# Patient Record
Sex: Male | Born: 1945
Health system: Southern US, Community
[De-identification: ages and names within clinical notes are randomized; demographics above are authoritative.]

## PROBLEM LIST (undated history)

## (undated) DIAGNOSIS — I10 Essential (primary) hypertension: Secondary | ICD-10-CM

## (undated) DIAGNOSIS — N400 Enlarged prostate without lower urinary tract symptoms: Secondary | ICD-10-CM

## (undated) DIAGNOSIS — N289 Disorder of kidney and ureter, unspecified: Secondary | ICD-10-CM

## (undated) DIAGNOSIS — I454 Nonspecific intraventricular block: Secondary | ICD-10-CM

## (undated) DIAGNOSIS — N200 Calculus of kidney: Secondary | ICD-10-CM

## (undated) DIAGNOSIS — M109 Gout, unspecified: Secondary | ICD-10-CM

## (undated) DIAGNOSIS — E119 Type 2 diabetes mellitus without complications: Secondary | ICD-10-CM

## (undated) HISTORY — PX: FOOT SURGERY: SHX648

---

## 2013-09-28 ENCOUNTER — Emergency Department (HOSPITAL_BASED_OUTPATIENT_CLINIC_OR_DEPARTMENT_OTHER): Payer: Medicare Other

## 2013-09-28 ENCOUNTER — Emergency Department (HOSPITAL_BASED_OUTPATIENT_CLINIC_OR_DEPARTMENT_OTHER)
Admission: EM | Admit: 2013-09-28 | Discharge: 2013-09-28 | Disposition: A | Discharge status: Home / Self Care | Payer: Medicare Other | Attending: Emergency Medicine | Admitting: Emergency Medicine

## 2013-09-28 ENCOUNTER — Encounter (HOSPITAL_BASED_OUTPATIENT_CLINIC_OR_DEPARTMENT_OTHER): Payer: Self-pay | Admitting: Emergency Medicine

## 2013-09-28 DIAGNOSIS — I10 Essential (primary) hypertension: Secondary | ICD-10-CM | POA: Insufficient documentation

## 2013-09-28 DIAGNOSIS — R110 Nausea: Secondary | ICD-10-CM | POA: Insufficient documentation

## 2013-09-28 DIAGNOSIS — M545 Low back pain, unspecified: Secondary | ICD-10-CM | POA: Diagnosis present

## 2013-09-28 DIAGNOSIS — N200 Calculus of kidney: Secondary | ICD-10-CM | POA: Diagnosis not present

## 2013-09-28 HISTORY — DX: Essential (primary) hypertension: I10

## 2013-09-28 HISTORY — DX: Type 2 diabetes mellitus without complications: E11.9

## 2013-09-28 HISTORY — DX: Benign prostatic hyperplasia without lower urinary tract symptoms: N40.0

## 2013-09-28 HISTORY — DX: Gout, unspecified: M10.9

## 2013-09-28 HISTORY — DX: Nonspecific intraventricular block: I45.4

## 2013-09-28 LAB — BASIC METABOLIC PANEL
ANION GAP: 16 — AB (ref 5–15)
BUN: 32 mg/dL — ABNORMAL HIGH (ref 6–23)
CALCIUM: 10.3 mg/dL (ref 8.4–10.5)
CO2: 24 mEq/L (ref 19–32)
Chloride: 104 mEq/L (ref 96–112)
Creatinine, Ser: 1.5 mg/dL — ABNORMAL HIGH (ref 0.50–1.35)
GFR calc non Af Amer: 47 mL/min — ABNORMAL LOW (ref >90)
GFR, EST AFRICAN AMERICAN: 54 mL/min — AB (ref >90)
Glucose, Bld: 166 mg/dL — ABNORMAL HIGH (ref 70–99)
Potassium: 4.4 mEq/L (ref 3.7–5.3)
SODIUM: 144 meq/L (ref 137–147)

## 2013-09-28 LAB — CBC WITH DIFFERENTIAL/PLATELET
BASOS ABS: 0.1 10*3/uL (ref 0.0–0.1)
Basophils Relative: 1 % (ref 0–1)
EOS PCT: 2 % (ref 0–5)
Eosinophils Absolute: 0.1 10*3/uL (ref 0.0–0.7)
HCT: 37.9 % — ABNORMAL LOW (ref 39.0–52.0)
Hemoglobin: 12.5 g/dL — ABNORMAL LOW (ref 13.0–17.0)
Lymphocytes Relative: 24 % (ref 12–46)
Lymphs Abs: 1.7 10*3/uL (ref 0.7–4.0)
MCH: 32.8 pg (ref 26.0–34.0)
MCHC: 33 g/dL (ref 30.0–36.0)
MCV: 99.5 fL (ref 78.0–100.0)
Monocytes Absolute: 0.4 10*3/uL (ref 0.1–1.0)
Monocytes Relative: 6 % (ref 3–12)
Neutro Abs: 4.8 10*3/uL (ref 1.7–7.7)
Neutrophils Relative %: 67 % (ref 43–77)
PLATELETS: 161 10*3/uL (ref 150–400)
RBC: 3.81 MIL/uL — ABNORMAL LOW (ref 4.22–5.81)
RDW: 13.3 % (ref 11.5–15.5)
WBC: 7.2 10*3/uL (ref 4.0–10.5)

## 2013-09-28 LAB — URINE MICROSCOPIC-ADD ON

## 2013-09-28 LAB — URINALYSIS, ROUTINE W REFLEX MICROSCOPIC
BILIRUBIN URINE: NEGATIVE
Glucose, UA: NEGATIVE mg/dL
KETONES UR: NEGATIVE mg/dL
Leukocytes, UA: NEGATIVE
NITRITE: NEGATIVE
PH: 5 (ref 5.0–8.0)
PROTEIN: NEGATIVE mg/dL
Specific Gravity, Urine: 1.025 (ref 1.005–1.030)
Urobilinogen, UA: 0.2 mg/dL (ref 0.0–1.0)

## 2013-09-28 MED ORDER — HYDROMORPHONE HCL PF 1 MG/ML IJ SOLN
0.5000 mg | Freq: Once | INTRAMUSCULAR | Status: AC
  Administered 2013-09-28: 0.5 mg via INTRAVENOUS
  Filled 2013-09-28: qty 1

## 2013-09-28 MED ORDER — TAMSULOSIN HCL 0.4 MG PO CAPS: 0.4000 mg | ORAL_CAPSULE | Freq: Every day | ORAL | Status: DC

## 2013-09-28 MED ORDER — ONDANSETRON HCL 4 MG/2ML IJ SOLN
4.0000 mg | Freq: Once | INTRAMUSCULAR | Status: AC
  Administered 2013-09-28: 4 mg via INTRAVENOUS
  Filled 2013-09-28: qty 2

## 2013-09-28 MED ORDER — OXYCODONE-ACETAMINOPHEN 5-325 MG PO TABS: 1.0000 | ORAL_TABLET | Freq: Four times a day (QID) | ORAL | Status: DC | PRN

## 2013-09-28 MED ORDER — ONDANSETRON 4 MG PO TBDP: ORAL_TABLET | ORAL | Status: DC

## 2013-09-28 MED ORDER — SODIUM CHLORIDE 0.9 % IV BOLUS (SEPSIS)
1000.0000 mL | INTRAVENOUS | Status: AC
  Administered 2013-09-28: 1000 mL via INTRAVENOUS

## 2013-09-28 NOTE — ED Notes (Addendum)
Patient states he woke up this morning he woke up at approximately 0600 today with mild back pain.  States the pain has progressively gotten worse through out the morning.  Describes pain and dull ache in his left lower back with radiation into left hip.  Patient is very athletic and had a normal workout yesterday.

## 2013-09-28 NOTE — ED Provider Notes (Signed)
CSN: [id number]     Arrival date & time 09/28/13  0750 History   First MD Initiated Contact with Patient 09/28/13 [phone]     Chief Complaint  Patient presents with  . Back Pain     (Consider location/radiation/quality/duration/timing/severity/associated sxs/prior Treatment) Patient is a 68 y.o. male presenting with back pain. The history is provided by the patient.  Back Pain Pain location: left lumbar paraspinal area. Quality:  Aching Radiates to: radiates to left hip. Pain severity:  Moderate Pain is:  Same all the time Onset quality:  Sudden Duration:  2 hours Timing:  Constant Progression:  Unchanged Chronicity:  New Context comment:  During a bm Relieved by:  Nothing Worsened by:  Nothing tried Ineffective treatments: 800mg  ibuprofen. Associated symptoms: no abdominal pain, no chest pain, no dysuria, no fever, no headaches and no numbness     Past Medical History  Diagnosis Date  . Hypertension    History reviewed. No pertinent past surgical history. No family history on file. History  Substance Use Topics  . Smoking status: Not on file  . Smokeless tobacco: Not on file  . Alcohol Use: Not on file    Review of Systems  Constitutional: Negative for fever.  HENT: Negative for drooling and rhinorrhea.   Eyes: Negative for pain.  Respiratory: Negative for cough and shortness of breath.   Cardiovascular: Negative for chest pain and leg swelling.  Gastrointestinal: Positive for nausea. Negative for vomiting, abdominal pain and diarrhea.  Genitourinary: Negative for dysuria and hematuria.  Musculoskeletal: Positive for back pain. Negative for gait problem and neck pain.  Skin: Negative for color change.  Neurological: Negative for numbness and headaches.  Hematological: Negative for adenopathy.  Psychiatric/Behavioral: Negative for behavioral problems.  All other systems reviewed and are negative.     Allergies  Review of patient's allergies indicates not on  file.  Home Medications   Prior to Admission medications   Not on File   BP 129/64  Pulse 47  Resp 20  SpO2 100% Physical Exam  Nursing note and vitals reviewed. Constitutional: He is oriented to person, place, and time. He appears well-developed and well-nourished.  Appears uncomfortable, walking around the room.   HENT:  Head: Normocephalic and atraumatic.  Right Ear: External ear normal.  Left Ear: External ear normal.  Nose: Nose normal.  Mouth/Throat: Oropharynx is clear and moist. No oropharyngeal exudate.  Eyes: Conjunctivae and EOM are normal. Pupils are equal, round, and reactive to light.  Neck: Normal range of motion. Neck supple.  Cardiovascular: Normal rate, regular rhythm, normal heart sounds and intact distal pulses.  Exam reveals no gallop and no friction rub.   No murmur heard. Pulmonary/Chest: Effort normal and breath sounds normal. No respiratory distress. He has no wheezes.  Abdominal: Soft. Bowel sounds are normal. He exhibits no distension. There is no tenderness. There is no rebound and no guarding.  Musculoskeletal: Normal range of motion. He exhibits no edema and no tenderness.  No vertebral tenderness to palpation.  No CVA ttp.   Neurological: He is alert and oriented to person, place, and time.  Skin: Skin is warm and dry.  Psychiatric: He has a normal mood and affect. His behavior is normal.    ED Course  Procedures (including critical care time) Labs Review Labs Reviewed  CBC WITH DIFFERENTIAL - Abnormal; Notable for the following:    RBC 3.81 (*)    Hemoglobin 12.5 (*)    HCT 37.9 (*)    All  other components within normal limits  BASIC METABOLIC PANEL - Abnormal; Notable for the following:    Glucose, Bld 166 (*)    BUN 32 (*)    Creatinine, Ser 1.50 (*)    GFR calc non Af Amer 47 (*)    GFR calc Af Amer 54 (*)    Anion gap 16 (*)    All other components within normal limits  URINALYSIS, ROUTINE W REFLEX MICROSCOPIC - Abnormal;  Notable for the following:    Hgb urine dipstick MODERATE (*)    All other components within normal limits  URINE MICROSCOPIC-ADD ON    Imaging Review Ct Renal Stone Study  09/28/2013   CLINICAL DATA:  Left flank pain  EXAM: CT RENAL STONE PROTOCOL  TECHNIQUE: Multidetector CT imaging of the abdomen and pelvis was performed following the standard protocol without intravenous contrast  COMPARISON:  None.  FINDINGS: Lung bases are clear.  Small renal calcifications bilaterally. Multiple 1 mm calcifications in the renal medulla suggesting nephrocalcinosis. Mild obstructive left kidney with dilatation of the collecting system. 1 mm stone in the distal left ureter at the left UVJ. Bladder is empty. No obstruction of the right kidney.  Liver and gallbladder are normal.  Pancreas and spleen are normal.  Negative for bowel obstruction or bowel thickening. Appendix normal. No free fluid.  Mild lumbar degenerative change without acute bony abnormality.  IMPRESSION: Nephrocalcinosis.  1 mm calculus distal left ureter causing mild obstruction of the left kidney.   Electronically Signed   By: Franchot Gallo M.D.   On: 09/28/2013 08:31     EKG Interpretation None      MDM   Final diagnoses:  Nephrolithiasis    8:10 AM 68 y.o. male w hx of HTN, DM who presents with left flank pain which began around 6 AM this morning while attempting to have a bowel movement. The patient appears uncomfortable on exam. He is afebrile and vital signs are unremarkable here. He is very active and rows daily. He denies any specific injury and his pain is not reproducible with palpation. He does have a family history of kidney stones although he has never had one. I suspect a kidney stone is the culprit of his symptoms. Will get screening labs and imaging.  9:49 AM: Found to have 58mm stone at the left distal ureter. Pain controlled.  I have discussed the diagnosis/risks/treatment options with the patient and family and believe the  pt to be eligible for discharge home to follow-up with his pcp and urology as needed. We also discussed returning to the ED immediately if new or worsening sx occur. We discussed the sx which are most concerning (e.g., worsening pain, fever, vomiting) that necessitate immediate return. Medications administered to the patient during their visit and any new prescriptions provided to the patient are listed below.  Medications given during this visit Medications  sodium chloride 0.9 % bolus 1,000 mL (1,000 mLs Intravenous New Bag/Given 09/28/13 0831)  HYDROmorphone (DILAUDID) injection 0.5 mg (0.5 mg Intravenous Given 09/28/13 0831)  ondansetron (ZOFRAN) injection 4 mg (4 mg Intravenous Given 09/28/13 0831)    Discharge Medication List as of 09/28/2013  9:52 AM    START taking these medications   Details  ondansetron (ZOFRAN ODT) 4 MG disintegrating tablet 4mg  ODT q4 hours prn nausea/vomit, Print    oxyCODONE-acetaminophen (PERCOCET) 5-325 MG per tablet Take 1 tablet by mouth every 6 (six) hours as needed for moderate pain., Starting 09/28/2013, Until Discontinued, Print  tamsulosin (FLOMAX) 0.4 MG CAPS capsule Take 1 capsule (0.4 mg total) by mouth daily., Starting 09/28/2013, Until Discontinued, Print         Pamella Pert, MD 09/28/13 1258

## 2013-09-28 NOTE — Discharge Instructions (Signed)

## 2014-11-24 ENCOUNTER — Emergency Department (HOSPITAL_BASED_OUTPATIENT_CLINIC_OR_DEPARTMENT_OTHER)
Admission: EM | Admit: 2014-11-24 | Discharge: 2014-11-24 | Disposition: A | Discharge status: Home / Self Care | Payer: Medicare Other | Attending: Emergency Medicine | Admitting: Emergency Medicine

## 2014-11-24 ENCOUNTER — Encounter (HOSPITAL_BASED_OUTPATIENT_CLINIC_OR_DEPARTMENT_OTHER): Payer: Self-pay | Admitting: *Deleted

## 2014-11-24 DIAGNOSIS — R200 Anesthesia of skin: Secondary | ICD-10-CM | POA: Diagnosis not present

## 2014-11-24 DIAGNOSIS — M5432 Sciatica, left side: Secondary | ICD-10-CM | POA: Diagnosis not present

## 2014-11-24 DIAGNOSIS — M109 Gout, unspecified: Secondary | ICD-10-CM | POA: Insufficient documentation

## 2014-11-24 DIAGNOSIS — E119 Type 2 diabetes mellitus without complications: Secondary | ICD-10-CM | POA: Insufficient documentation

## 2014-11-24 DIAGNOSIS — I10 Essential (primary) hypertension: Secondary | ICD-10-CM | POA: Diagnosis not present

## 2014-11-24 DIAGNOSIS — Z79899 Other long term (current) drug therapy: Secondary | ICD-10-CM | POA: Diagnosis not present

## 2014-11-24 DIAGNOSIS — N400 Enlarged prostate without lower urinary tract symptoms: Secondary | ICD-10-CM | POA: Diagnosis not present

## 2014-11-24 DIAGNOSIS — Z7982 Long term (current) use of aspirin: Secondary | ICD-10-CM | POA: Diagnosis not present

## 2014-11-24 DIAGNOSIS — M545 Low back pain: Secondary | ICD-10-CM | POA: Diagnosis present

## 2014-11-24 LAB — URINALYSIS, ROUTINE W REFLEX MICROSCOPIC
Bilirubin Urine: NEGATIVE
Glucose, UA: NEGATIVE mg/dL
HGB URINE DIPSTICK: NEGATIVE
Ketones, ur: NEGATIVE mg/dL
Leukocytes, UA: NEGATIVE
NITRITE: NEGATIVE
Protein, ur: NEGATIVE mg/dL
SPECIFIC GRAVITY, URINE: 1.007 (ref 1.005–1.030)
UROBILINOGEN UA: 0.2 mg/dL (ref 0.0–1.0)
pH: 7 (ref 5.0–8.0)

## 2014-11-24 MED ORDER — PREDNISONE 20 MG PO TABS: ORAL_TABLET | ORAL | Status: DC

## 2014-11-24 MED ORDER — OXYCODONE-ACETAMINOPHEN 5-325 MG PO TABS: 1.0000 | ORAL_TABLET | Freq: Four times a day (QID) | ORAL | Status: DC | PRN

## 2014-11-24 NOTE — ED Notes (Signed)
Back pain, now presents with left hip and left leg pain & numbness, increases with left leg extension, pain increases when standing and placing weight on left leg

## 2014-11-24 NOTE — ED Provider Notes (Signed)
CSN: [id number]     Arrival date & time 11/24/14  6962 History   First MD Initiated Contact with Patient 11/24/14 [phone]     Chief Complaint  Patient presents with  . Back Pain     (Consider location/radiation/quality/duration/timing/severity/associated sxs/prior Treatment) HPI  69 year old male presents with left-sided hip/buttock pain that radiates down his leg. Started off with low back pain about a 1-1/2 weeks ago which is not uncommon for him. He does a lot of lifting and bending at work. Pain got worse after lifting rolling equipment. Over the last 36 hours pain is shifted from his low back to now his left hip and seems to radiate down his leg. No focal injury. Leg seems to give out on him and feel weak after walking for a minute or 2. Has a chronic history of urinating a lot but states that over the past 24-36 hrs. he has had increasing urination and feels like he is not always clearing his urine. No bowel incontinence. No saddle anesthesia. When he is at rest and lying still he has no pain at all. No weakness or numbness rest. Took an oxycodone this morning with some relief.  Past Medical History  Diagnosis Date  . Hypertension   . Diabetes mellitus without complication ([place])   . Gout   . BBB (bundle branch block)     Left  . BPH (benign prostatic hyperplasia)    Past Surgical History  Procedure Laterality Date  . Foot surgery     No family history on file. Social History  Substance Use Topics  . Smoking status: Never Smoker   . Smokeless tobacco: None  . Alcohol Use: Yes     Comment: one beer a day    Review of Systems  Constitutional: Negative for fever.  Gastrointestinal: Negative for vomiting and abdominal pain.  Genitourinary: Negative for dysuria and hematuria.  Musculoskeletal: Positive for back pain.  Neurological: Positive for numbness. Negative for weakness.  All other systems reviewed and are negative.     Allergies  Review of patient's allergies  indicates no known allergies.  Home Medications   Prior to Admission medications   Medication Sig Start Date End Date Taking? Authorizing Provider  allopurinol (ZYLOPRIM) 300 MG tablet Take 300 mg by mouth daily.    Historical Provider, MD  aspirin 81 MG tablet Take 81 mg by mouth daily.    Historical Provider, MD  benazepril (LOTENSIN) 20 MG tablet Take 20 mg by mouth daily.    Historical Provider, MD  metFORMIN (GLUCOPHAGE) 500 MG tablet Take 2,000 mg by mouth 2 (two) times daily with a meal.    Historical Provider, MD  ondansetron (ZOFRAN ODT) 4 MG disintegrating tablet 4mg  ODT q4 hours prn nausea/vomit 09/28/13   Pamella Pert, MD  oxyCODONE-acetaminophen (PERCOCET) 5-325 MG per tablet Take 1 tablet by mouth every 6 (six) hours as needed for moderate pain. 09/28/13   Pamella Pert, MD  tamsulosin (FLOMAX) 0.4 MG CAPS capsule Take 1 capsule (0.4 mg total) by mouth daily. 09/28/13   Pamella Pert, MD   BP 167/91 mmHg  Pulse 74  Temp(Src) 97.5 F (36.4 C) (Oral)  Resp 18  SpO2 100% Physical Exam  Constitutional: He is oriented to person, place, and time. He appears well-developed and well-nourished.  HENT:  Head: Normocephalic and atraumatic.  Right Ear: External ear normal.  Left Ear: External ear normal.  Nose: Nose normal.  Eyes: Right eye exhibits no discharge. Left eye exhibits no discharge.  Neck: Neck supple.  Cardiovascular: Normal rate, regular rhythm, normal heart sounds and intact distal pulses.   Pulmonary/Chest: Effort normal and breath sounds normal.  Abdominal: Soft. He exhibits no distension. There is no tenderness.  Musculoskeletal: He exhibits no edema.       Right hip: He exhibits normal range of motion.       Left hip: He exhibits normal range of motion.       Lumbar back: He exhibits tenderness (mild).  Neurological: He is alert and oriented to person, place, and time.  Reflex Scores:      Patellar reflexes are 2+ on the right side and 2+ on the left  side.      Achilles reflexes are 2+ on the right side and 2+ on the left side. 5/5 strength in bilateral lower extremities. Grossly normal sensation. +SLR in Right, causing symptoms on left.  Skin: Skin is warm and dry.  Nursing note and vitals reviewed.   ED Course  Procedures (including critical care time) Labs Review Labs Reviewed  URINALYSIS, ROUTINE W REFLEX MICROSCOPIC (NOT AT [facility])    Imaging Review No results found. I have personally reviewed and evaluated these images and lab results as part of my medical decision-making.   EKG Interpretation None      MDM   Final diagnoses:  Left sided sciatica    Patient symptoms are consistent with sciatica. When he is at rest he has essentially 0 pain. However when he stands up to walk his pain progressively worsens to the point that he is unable to bear weight on his left side. There does not seem to be weakness but more increasing pain. He is able to stand on just his left leg alone and support his body weight. He has some urinary retention but still under 200 mL (145 total) and thus I highly doubt an acute spinal cord emergency. Do not feel emergent MRI is indicated but he may need one closely as an outpatient with his PCP. No saddle anesthesia or other concerning findings. Will treat pain and after discussion about risk, benefit, and possible effectiveness will start on a steroid taper. Discussed strict return precautions and recommend close follow-up with his PCP.    Sherwood Gambler, MD 11/24/14 [phone]

## 2014-11-24 NOTE — ED Notes (Signed)
Placed in a position of comfort

## 2014-11-24 NOTE — ED Notes (Signed)
MD at bedside. 

## 2014-11-24 NOTE — ED Notes (Signed)
Pt states took one (1) oxycodone tab at 0700hrs

## 2015-05-29 ENCOUNTER — Emergency Department (HOSPITAL_BASED_OUTPATIENT_CLINIC_OR_DEPARTMENT_OTHER): Payer: Medicare Other

## 2015-05-29 ENCOUNTER — Emergency Department (HOSPITAL_BASED_OUTPATIENT_CLINIC_OR_DEPARTMENT_OTHER)
Admission: EM | Admit: 2015-05-29 | Discharge: 2015-05-29 | Disposition: A | Discharge status: Home / Self Care | Payer: Medicare Other | Attending: Emergency Medicine | Admitting: Emergency Medicine

## 2015-05-29 ENCOUNTER — Encounter (HOSPITAL_BASED_OUTPATIENT_CLINIC_OR_DEPARTMENT_OTHER): Payer: Self-pay | Admitting: *Deleted

## 2015-05-29 DIAGNOSIS — E119 Type 2 diabetes mellitus without complications: Secondary | ICD-10-CM | POA: Diagnosis not present

## 2015-05-29 DIAGNOSIS — N200 Calculus of kidney: Secondary | ICD-10-CM | POA: Diagnosis not present

## 2015-05-29 DIAGNOSIS — Z7984 Long term (current) use of oral hypoglycemic drugs: Secondary | ICD-10-CM | POA: Diagnosis not present

## 2015-05-29 DIAGNOSIS — Z7982 Long term (current) use of aspirin: Secondary | ICD-10-CM | POA: Diagnosis not present

## 2015-05-29 DIAGNOSIS — Z79899 Other long term (current) drug therapy: Secondary | ICD-10-CM | POA: Diagnosis not present

## 2015-05-29 DIAGNOSIS — I10 Essential (primary) hypertension: Secondary | ICD-10-CM | POA: Diagnosis not present

## 2015-05-29 DIAGNOSIS — R109 Unspecified abdominal pain: Secondary | ICD-10-CM | POA: Diagnosis present

## 2015-05-29 HISTORY — DX: Disorder of kidney and ureter, unspecified: N28.9

## 2015-05-29 HISTORY — DX: Calculus of kidney: N20.0

## 2015-05-29 LAB — URINE MICROSCOPIC-ADD ON

## 2015-05-29 LAB — COMPREHENSIVE METABOLIC PANEL
ALBUMIN: 4.9 g/dL (ref 3.5–5.0)
ALT: 19 U/L (ref 17–63)
AST: 26 U/L (ref 15–41)
Alkaline Phosphatase: 59 U/L (ref 38–126)
Anion gap: 12 (ref 5–15)
BUN: 33 mg/dL — ABNORMAL HIGH (ref 6–20)
CO2: 20 mmol/L — ABNORMAL LOW (ref 22–32)
CREATININE: 1.75 mg/dL — AB (ref 0.61–1.24)
Calcium: 9.5 mg/dL (ref 8.9–10.3)
Chloride: 106 mmol/L (ref 101–111)
GFR calc Af Amer: 44 mL/min — ABNORMAL LOW (ref >60)
GFR calc non Af Amer: 38 mL/min — ABNORMAL LOW (ref >60)
GLUCOSE: 168 mg/dL — AB (ref 65–99)
POTASSIUM: 4.3 mmol/L (ref 3.5–5.1)
Sodium: 138 mmol/L (ref 135–145)
Total Bilirubin: 0.9 mg/dL (ref 0.3–1.2)
Total Protein: 7.3 g/dL (ref 6.5–8.1)

## 2015-05-29 LAB — URINALYSIS, ROUTINE W REFLEX MICROSCOPIC
GLUCOSE, UA: NEGATIVE mg/dL
KETONES UR: 40 mg/dL — AB
LEUKOCYTES UA: NEGATIVE
NITRITE: NEGATIVE
Protein, ur: NEGATIVE mg/dL
SPECIFIC GRAVITY, URINE: 1.026 (ref 1.005–1.030)
pH: 5 (ref 5.0–8.0)

## 2015-05-29 LAB — CBC WITH DIFFERENTIAL/PLATELET
Basophils Absolute: 0 10*3/uL (ref 0.0–0.1)
Basophils Relative: 0 %
EOS ABS: 0 10*3/uL (ref 0.0–0.7)
Eosinophils Relative: 0 %
HCT: 37.7 % — ABNORMAL LOW (ref 39.0–52.0)
Hemoglobin: 12.9 g/dL — ABNORMAL LOW (ref 13.0–17.0)
LYMPHS ABS: 0.3 10*3/uL — AB (ref 0.7–4.0)
LYMPHS PCT: 3 %
MCH: 32.7 pg (ref 26.0–34.0)
MCHC: 34.2 g/dL (ref 30.0–36.0)
MCV: 95.7 fL (ref 78.0–100.0)
MONO ABS: 0.2 10*3/uL (ref 0.1–1.0)
Monocytes Relative: 2 %
Neutro Abs: 9.6 10*3/uL — ABNORMAL HIGH (ref 1.7–7.7)
Neutrophils Relative %: 95 %
PLATELETS: 152 10*3/uL (ref 150–400)
RBC: 3.94 MIL/uL — ABNORMAL LOW (ref 4.22–5.81)
RDW: 12.6 % (ref 11.5–15.5)
WBC: 10.2 10*3/uL (ref 4.0–10.5)

## 2015-05-29 MED ORDER — TAMSULOSIN HCL 0.4 MG PO CAPS: 0.4000 mg | ORAL_CAPSULE | Freq: Every day | ORAL | Status: DC

## 2015-05-29 MED ORDER — TAMSULOSIN HCL 0.4 MG PO CAPS
0.4000 mg | ORAL_CAPSULE | Freq: Once | ORAL | Status: AC
  Administered 2015-05-29: 0.4 mg via ORAL
  Filled 2015-05-29: qty 1

## 2015-05-29 MED ORDER — OXYCODONE-ACETAMINOPHEN 5-325 MG PO TABS
2.0000 | ORAL_TABLET | Freq: Once | ORAL | Status: AC
  Administered 2015-05-29: 2 via ORAL
  Filled 2015-05-29: qty 2

## 2015-05-29 MED ORDER — FENTANYL CITRATE (PF) 100 MCG/2ML IJ SOLN
100.0000 ug | Freq: Once | INTRAMUSCULAR | Status: AC
  Administered 2015-05-29: 100 ug via INTRAVENOUS
  Filled 2015-05-29: qty 2

## 2015-05-29 MED ORDER — OXYCODONE-ACETAMINOPHEN 5-325 MG PO TABS: 1.0000 | ORAL_TABLET | Freq: Four times a day (QID) | ORAL | Status: DC | PRN

## 2015-05-29 MED FILL — OXYCODONE/APAP 5-325: 5-325 | 3 days supply | Qty: 30 | Fill #0

## 2015-05-29 MED FILL — TAMSULOSIN HCL 0.4 MG CAP: 0.4 | 30 days supply | Qty: 30 | Fill #0

## 2015-05-29 NOTE — ED Notes (Signed)
C/o left flank pain, with difficulty urinating. Onset 0115 this am that woke him up. NO n/v.

## 2015-05-29 NOTE — ED Notes (Signed)
Pt pacing in room, came to desk states pain is worse. Pain is 10/10. Dr Dayna Barker aware.

## 2015-05-29 NOTE — ED Provider Notes (Signed)
CSN: [id number]     Arrival date & time 05/29/15  [id number] History   First MD Initiated Contact with Patient 05/29/15 [phone]     No chief complaint on file.    (Consider location/radiation/quality/duration/timing/severity/associated sxs/prior Treatment) Patient is a 70 y.o. male presenting with flank pain.  Flank Pain This is a new problem. The problem occurs constantly. Pertinent negatives include no chest pain and no abdominal pain. Nothing aggravates the symptoms. Relieved by: oxycodone. The treatment provided no relief.    Past Medical History  Diagnosis Date  . Hypertension   . Diabetes mellitus without complication ([place])   . Gout   . BBB (bundle branch block)     Left  . BPH (benign prostatic hyperplasia)   . Renal disorder   . Kidney stones    Past Surgical History  Procedure Laterality Date  . Foot surgery     No family history on file. Social History  Substance Use Topics  . Smoking status: Never Smoker   . Smokeless tobacco: None  . Alcohol Use: Yes     Comment: one beer a day    Review of Systems  Constitutional: Negative for fever and chills.  Cardiovascular: Negative for chest pain.  Gastrointestinal: Negative for abdominal pain.  Genitourinary: Positive for flank pain and decreased urine volume. Negative for penile swelling and penile pain.  All other systems reviewed and are negative.     Allergies  Review of patient's allergies indicates no known allergies.  Home Medications   Prior to Admission medications   Medication Sig Start Date End Date Taking? Authorizing Provider  allopurinol (ZYLOPRIM) 300 MG tablet Take 300 mg by mouth daily.   Yes Historical Provider, MD  aspirin 81 MG tablet Take 81 mg by mouth daily.   Yes Historical Provider, MD  atorvastatin (LIPITOR) 40 MG tablet Take 40 mg by mouth daily.   Yes Historical Provider, MD  benazepril (LOTENSIN) 20 MG tablet Take 20 mg by mouth daily.   Yes Historical Provider, MD  metFORMIN (GLUCOPHAGE)  500 MG tablet Take 2,000 mg by mouth 2 (two) times daily with a meal.   Yes Historical Provider, MD  oxyCODONE-acetaminophen (PERCOCET/ROXICET) 5-325 MG tablet Take 1-2 tablets by mouth every 6 (six) hours as needed for severe pain. 05/29/15   Merrily Pew, MD  tamsulosin (FLOMAX) 0.4 MG CAPS capsule Take 1 capsule (0.4 mg total) by mouth daily. Until stone passes 05/29/15   Merrily Pew, MD   BP 112/60 mmHg  Pulse 61  Temp(Src) 97.6 F (36.4 C) (Oral)  Resp 18  Ht 5\' 7"  (1.702 m)  Wt 161 lb (73.029 kg)  BMI 25.21 kg/m2  SpO2 100% Physical Exam  Constitutional: He is oriented to person, place, and time. He appears well-developed and well-nourished.  HENT:  Head: Normocephalic and atraumatic.  Neck: Normal range of motion.  Cardiovascular: Normal rate.   Pulmonary/Chest: Effort normal. No respiratory distress.  Abdominal: He exhibits no distension.  Musculoskeletal: Normal range of motion. He exhibits no edema or tenderness.  Neurological: He is alert and oriented to person, place, and time. No cranial nerve deficit. Coordination normal.  Skin: Skin is warm and dry. No rash noted. No erythema.  Nursing note and vitals reviewed.   ED Course  Procedures (including critical care time) Labs Review Labs Reviewed  URINALYSIS, ROUTINE W REFLEX MICROSCOPIC (NOT AT [facility]) - Abnormal; Notable for the following:    Hgb urine dipstick LARGE (*)    Bilirubin Urine SMALL (*)  Ketones, ur 40 (*)    All other components within normal limits  COMPREHENSIVE METABOLIC PANEL - Abnormal; Notable for the following:    CO2 20 (*)    Glucose, Bld 168 (*)    BUN 33 (*)    Creatinine, Ser 1.75 (*)    GFR calc non Af Amer 38 (*)    GFR calc Af Amer 44 (*)    All other components within normal limits  CBC WITH DIFFERENTIAL/PLATELET - Abnormal; Notable for the following:    RBC 3.94 (*)    Hemoglobin 12.9 (*)    HCT 37.7 (*)    Neutro Abs 9.6 (*)    Lymphs Abs 0.3 (*)    All other components within  normal limits  URINE MICROSCOPIC-ADD ON - Abnormal; Notable for the following:    Squamous Epithelial / LPF 0-5 (*)    Bacteria, UA FEW (*)    All other components within normal limits    Imaging Review Ct Renal Stone Study  05/29/2015  CLINICAL DATA:  Left flank pain for 1 day EXAM: CT ABDOMEN AND PELVIS WITHOUT CONTRAST TECHNIQUE: Multidetector CT imaging of the abdomen and pelvis was performed following the standard protocol without oral or intravenous contrast material administration. COMPARISON:  September 28, 2013 FINDINGS: Lower chest: Lung bases are clear. There is calcification in the visualized distal right coronary artery. Hepatobiliary: No focal liver lesions are identified on this noncontrast enhanced study. Gallbladder wall is not appreciably thickened. There is no biliary duct dilatation. Pancreas: No pancreatic mass or inflammatory focus. Spleen: No splenic lesions are evident. Adrenals/Urinary Tract: Adrenals appear normal bilaterally. There is a cyst arising from the lower pole the right kidney measuring 2.5 x 2.1 cm. There are multiple 1 mm calculi scattered throughout the right kidney. There is no hydronephrosis on the right. There is no right-sided ureteral calculus. On the left, the kidney is edematous. No mass is seen. There is mild to moderate hydronephrosis on the left. There are multiple 1 mm calculi scattered throughout the left kidney. There is a 2 mm calculus in the lower pole left kidney. There is a 2 mm calculus at the left ureterovesical junction, best seen on axial slice 64 series 2. No other ureteral calculi are evident. Urinary bladder is midline with wall thickness within normal limits. Stomach/Bowel: There is no bowel wall or mesenteric thickening. No bowel obstruction. No free air or portal venous air. Vascular/Lymphatic: There is atherosclerotic calcification in the aorta and common iliac arteries. No aneurysm evident. The major mesenteric vessels appear intact on this  noncontrast enhanced study. There is no adenopathy in the abdomen or pelvis. Reproductive: Prostate and seminal vesicles appear normal in size and contour. There is no pelvic mass or pelvic fluid collection. Other: Appendix appears normal. There is no ascites or abscess in the abdomen or pelvis. Musculoskeletal: There is degenerative change in the lumbar spine. There are no blastic or lytic bone lesions. There is no intramuscular or abdominal wall lesion. IMPRESSION: 2 mm calculus left ureterovesical junction with mild to moderate hydronephrosis on the left. There are multiple small calculi in each kidney, nonobstructing. Left kidney mildly edematous. No bowel obstruction.  No abscess.  Appendix appears normal. Calcification noted in the visualized distal right coronary artery. Multiple foci of atherosclerotic change in the aorta and pelvic arterial vessels. Electronically Signed   By: Lowella Grip III M.D.   On: 05/29/2015 09:44   I have personally reviewed and evaluated these images and lab results  as part of my medical decision-making.   EKG Interpretation None      MDM   Final diagnoses:  Kidney stone   Left flank pain, similar to previous kidney stones. 2 mm stone on CT.  Will tx outpatient. Likely related to dehydration from recent weight cutting.   New Prescriptions: Discharge Medication List as of 05/29/2015 10:25 AM    START taking these medications   Details  tamsulosin (FLOMAX) 0.4 MG CAPS capsule Take 1 capsule (0.4 mg total) by mouth daily. Until stone passes, Starting 05/29/2015, Until Discontinued, Print         I have personally and contemperaneously reviewed labs and imaging and used in my decision making as above.   A medical screening exam was performed and I feel the patient has had an appropriate workup for their chief complaint at this time and likelihood of emergent condition existing is low and thus workup can continue on an outpatient basis.. Their vital signs  are stable. They have been counseled on decision, discharge, follow up and which symptoms necessitate immediate return to the emergency department.  They verbally stated understanding and agreement with plan and discharged in stable condition.      Merrily Pew, MD 05/29/15 [phone]

## 2016-03-02 ENCOUNTER — Emergency Department (HOSPITAL_BASED_OUTPATIENT_CLINIC_OR_DEPARTMENT_OTHER): Payer: Medicare Other

## 2016-03-02 ENCOUNTER — Encounter (HOSPITAL_BASED_OUTPATIENT_CLINIC_OR_DEPARTMENT_OTHER): Payer: Self-pay | Admitting: Emergency Medicine

## 2016-03-02 ENCOUNTER — Emergency Department (HOSPITAL_BASED_OUTPATIENT_CLINIC_OR_DEPARTMENT_OTHER)
Admission: EM | Admit: 2016-03-02 | Discharge: 2016-03-02 | Disposition: A | Discharge status: Home / Self Care | Payer: Medicare Other | Attending: Emergency Medicine | Admitting: Emergency Medicine

## 2016-03-02 DIAGNOSIS — E119 Type 2 diabetes mellitus without complications: Secondary | ICD-10-CM | POA: Insufficient documentation

## 2016-03-02 DIAGNOSIS — Z7984 Long term (current) use of oral hypoglycemic drugs: Secondary | ICD-10-CM | POA: Diagnosis not present

## 2016-03-02 DIAGNOSIS — Z7982 Long term (current) use of aspirin: Secondary | ICD-10-CM | POA: Insufficient documentation

## 2016-03-02 DIAGNOSIS — Z79899 Other long term (current) drug therapy: Secondary | ICD-10-CM | POA: Diagnosis not present

## 2016-03-02 DIAGNOSIS — N200 Calculus of kidney: Secondary | ICD-10-CM | POA: Diagnosis not present

## 2016-03-02 DIAGNOSIS — I10 Essential (primary) hypertension: Secondary | ICD-10-CM | POA: Diagnosis not present

## 2016-03-02 DIAGNOSIS — R1032 Left lower quadrant pain: Secondary | ICD-10-CM | POA: Diagnosis present

## 2016-03-02 LAB — URINALYSIS, MICROSCOPIC (REFLEX)

## 2016-03-02 LAB — CBC WITH DIFFERENTIAL/PLATELET
BASOS ABS: 0 10*3/uL (ref 0.0–0.1)
Basophils Relative: 1 %
Eosinophils Absolute: 0.1 10*3/uL (ref 0.0–0.7)
Eosinophils Relative: 1 %
HCT: 40.1 % (ref 39.0–52.0)
Hemoglobin: 13.6 g/dL (ref 13.0–17.0)
Lymphocytes Relative: 18 %
Lymphs Abs: 1.6 10*3/uL (ref 0.7–4.0)
MCH: 32.2 pg (ref 26.0–34.0)
MCHC: 33.9 g/dL (ref 30.0–36.0)
MCV: 95 fL (ref 78.0–100.0)
Monocytes Absolute: 0.5 10*3/uL (ref 0.1–1.0)
Monocytes Relative: 6 %
Neutro Abs: 6.5 10*3/uL (ref 1.7–7.7)
Neutrophils Relative %: 74 %
Platelets: 152 10*3/uL (ref 150–400)
RBC: 4.22 MIL/uL (ref 4.22–5.81)
RDW: 12.8 % (ref 11.5–15.5)
WBC: 8.7 10*3/uL (ref 4.0–10.5)

## 2016-03-02 LAB — URINALYSIS, ROUTINE W REFLEX MICROSCOPIC
Bilirubin Urine: NEGATIVE
GLUCOSE, UA: NEGATIVE mg/dL
Ketones, ur: NEGATIVE mg/dL
LEUKOCYTES UA: NEGATIVE
Nitrite: NEGATIVE
Protein, ur: NEGATIVE mg/dL
Specific Gravity, Urine: 1.022 (ref 1.005–1.030)
pH: 5.5 (ref 5.0–8.0)

## 2016-03-02 LAB — COMPREHENSIVE METABOLIC PANEL
ALT: 13 U/L — ABNORMAL LOW (ref 17–63)
ANION GAP: 9 (ref 5–15)
AST: 21 U/L (ref 15–41)
Albumin: 4.5 g/dL (ref 3.5–5.0)
Alkaline Phosphatase: 60 U/L (ref 38–126)
BILIRUBIN TOTAL: 0.7 mg/dL (ref 0.3–1.2)
BUN: 23 mg/dL — AB (ref 6–20)
CO2: 25 mmol/L (ref 22–32)
Calcium: 9.2 mg/dL (ref 8.9–10.3)
Chloride: 106 mmol/L (ref 101–111)
Creatinine, Ser: 1.36 mg/dL — ABNORMAL HIGH (ref 0.61–1.24)
GFR calc Af Amer: 60 mL/min — ABNORMAL LOW (ref >60)
GFR, EST NON AFRICAN AMERICAN: 52 mL/min — AB (ref >60)
Glucose, Bld: 163 mg/dL — ABNORMAL HIGH (ref 65–99)
Potassium: 3.8 mmol/L (ref 3.5–5.1)
Sodium: 140 mmol/L (ref 135–145)
Total Protein: 7 g/dL (ref 6.5–8.1)

## 2016-03-02 MED ORDER — HYDROMORPHONE HCL 1 MG/ML IJ SOLN
1.0000 mg | Freq: Once | INTRAMUSCULAR | Status: AC
  Administered 2016-03-02: 1 mg via INTRAVENOUS

## 2016-03-02 MED ORDER — TAMSULOSIN HCL 0.4 MG PO CAPS: 0.4000 mg | ORAL_CAPSULE | Freq: Every day | ORAL | 0 refills | Status: DC

## 2016-03-02 MED ORDER — MORPHINE SULFATE (PF) 4 MG/ML IV SOLN
4.0000 mg | Freq: Once | INTRAVENOUS | Status: AC
  Administered 2016-03-02: 4 mg via INTRAVENOUS

## 2016-03-02 MED ORDER — SODIUM CHLORIDE 0.9 % IV BOLUS (SEPSIS)
1000.0000 mL | Freq: Once | INTRAVENOUS | Status: AC
  Administered 2016-03-02: 1000 mL via INTRAVENOUS

## 2016-03-02 MED ORDER — OXYCODONE-ACETAMINOPHEN 5-325 MG PO TABS
2.0000 | ORAL_TABLET | Freq: Once | ORAL | Status: AC
  Administered 2016-03-02: 2 via ORAL
  Filled 2016-03-02: qty 2

## 2016-03-02 MED ORDER — MORPHINE SULFATE (PF) 4 MG/ML IV SOLN
4.0000 mg | Freq: Once | INTRAVENOUS | Status: AC
  Administered 2016-03-02: 4 mg via INTRAVENOUS
  Filled 2016-03-02: qty 1

## 2016-03-02 MED ORDER — MORPHINE SULFATE (PF) 4 MG/ML IV SOLN
INTRAVENOUS | Status: AC
  Administered 2016-03-02: 4 mg via INTRAVENOUS
  Filled 2016-03-02: qty 1

## 2016-03-02 MED ORDER — HYDROMORPHONE HCL 1 MG/ML IJ SOLN
INTRAMUSCULAR | Status: AC
  Administered 2016-03-02: 1 mg via INTRAVENOUS
  Filled 2016-03-02: qty 1

## 2016-03-02 MED ORDER — OXYCODONE-ACETAMINOPHEN 5-325 MG PO TABS: 1.0000 | ORAL_TABLET | ORAL | 0 refills | Status: DC | PRN

## 2016-03-02 MED ORDER — ONDANSETRON HCL 4 MG/2ML IJ SOLN
4.0000 mg | Freq: Once | INTRAMUSCULAR | Status: AC
  Administered 2016-03-02: 4 mg via INTRAVENOUS

## 2016-03-02 MED ORDER — ONDANSETRON HCL 4 MG/2ML IJ SOLN
INTRAMUSCULAR | Status: AC
  Administered 2016-03-02: 4 mg via INTRAVENOUS
  Filled 2016-03-02: qty 2

## 2016-03-02 MED FILL — OXYCODONE/APAP 5/325 MG TAB: 5-325 | 2 days supply | Qty: 12 | Fill #0

## 2016-03-02 MED FILL — TAMSULOSIN HCL 0.4 MG CAP: 0.4 | 7 days supply | Qty: 7 | Fill #0

## 2016-03-02 NOTE — ED Provider Notes (Signed)
[facility] DEPT Provider Note   CSN: [id number] Arrival date & time: 03/02/16 0725     History    Chief Complaint  Patient presents with  . Flank Pain     HPI Daniel Ingram is a 71 y.o. male.  71yo M w/ PMH below including HTN, T2DM, gout, kidney stones who p/w L flank pain. Around 6 AM this morning, he had a relatively sudden onset of left flank pain radiating into his left groin. The pain is intermittent and severe. This feels exactly like previous episodes of kidney stones. He had some urinary hesitancy this morning but denies any hematuria. He denies any associated nausea, vomiting, diarrhea, or blood in his stool.   He incidentally notes a one-month history of cough that initially began with 3 days of URI symptoms. All of the symptoms resolved except for the cough that has been persistent. He reports that it is intermittently productive. He denies any associated chest pain or shortness of breath. Occasional mild subjective fever.  Past Medical History:  Diagnosis Date  . BBB (bundle branch block)    Left  . BPH (benign prostatic hyperplasia)   . Diabetes mellitus without complication ([place])   . Gout   . Hypertension   . Kidney stones   . Renal disorder      There are no active problems to display for this patient.   Past Surgical History:  Procedure Laterality Date  . FOOT SURGERY          Home Medications    Prior to Admission medications   Medication Sig Start Date End Date Taking? Authorizing Provider  allopurinol (ZYLOPRIM) 300 MG tablet Take 300 mg by mouth daily.    Historical Provider, MD  aspirin 81 MG tablet Take 81 mg by mouth daily.    Historical Provider, MD  atorvastatin (LIPITOR) 40 MG tablet Take 40 mg by mouth daily.    Historical Provider, MD  benazepril (LOTENSIN) 20 MG tablet Take 20 mg by mouth daily.    Historical Provider, MD  metFORMIN (GLUCOPHAGE) 500 MG tablet Take 2,000 mg by mouth 2 (two) times daily with a meal.     Historical Provider, MD  oxyCODONE-acetaminophen (PERCOCET) 5-325 MG tablet Take 1-2 tablets by mouth every 4 (four) hours as needed for severe pain. 03/02/16   Sharlett Iles, MD  tamsulosin (FLOMAX) 0.4 MG CAPS capsule Take 1 capsule (0.4 mg total) by mouth daily. 03/02/16   Sharlett Iles, MD      No family history on file.   Social History  Substance Use Topics  . Smoking status: Never Smoker  . Smokeless tobacco: Never Used  . Alcohol use Yes     Comment: one beer a day     Allergies     Patient has no known allergies.    Review of Systems  10 Systems reviewed and are negative for acute change except as noted in the HPI.   Physical Exam Updated Vital Signs BP 136/79 (BP Location: Left Arm)   Pulse 89   Temp 98 F (36.7 C) (Oral)   Resp 18   Ht 5\' 7"  (1.702 m)   Wt 170 lb (77.1 kg)   SpO2 93%   BMI 26.63 kg/m   Physical Exam  Constitutional: He is oriented to person, place, and time. He appears well-developed and well-nourished. He appears distressed.  Standing up, bent over bed, in mild distress   HENT:  Head: Normocephalic and atraumatic.  Moist mucous membranes  Eyes: Conjunctivae are normal. Pupils are equal, round, and reactive to light.  Neck: Neck supple.  Cardiovascular: Normal rate, regular rhythm and normal heart sounds.   No murmur heard. Pulmonary/Chest: Effort normal and breath sounds normal.  Occasional cough  Abdominal: Soft. Bowel sounds are normal. He exhibits no distension. There is tenderness (mild LLQ). There is no rebound and no guarding.  Musculoskeletal: He exhibits no edema.  Neurological: He is alert and oriented to person, place, and time.  Fluent speech  Skin: Skin is warm and dry.  Psychiatric: Judgment normal.  In mild distress due to pain, but pleasant and cooperative  Nursing note and vitals reviewed.     ED Treatments / Results  Labs (all labs ordered are listed, but only abnormal results are  displayed) Labs Reviewed  COMPREHENSIVE METABOLIC PANEL - Abnormal; Notable for the following:       Result Value   Glucose, Bld 163 (*)    BUN 23 (*)    Creatinine, Ser 1.36 (*)    ALT 13 (*)    GFR calc non Af Amer 52 (*)    GFR calc Af Amer 60 (*)    All other components within normal limits  URINALYSIS, ROUTINE W REFLEX MICROSCOPIC - Abnormal; Notable for the following:    Hgb urine dipstick LARGE (*)    All other components within normal limits  URINALYSIS, MICROSCOPIC (REFLEX) - Abnormal; Notable for the following:    Bacteria, UA MANY (*)    Squamous Epithelial / LPF 0-5 (*)    All other components within normal limits  URINE CULTURE  CBC WITH DIFFERENTIAL/PLATELET     EKG  EKG Interpretation  Date/Time:    Ventricular Rate:    PR Interval:    QRS Duration:   QT Interval:    QTC Calculation:   R Axis:     Text Interpretation:           Radiology Dg Chest 2 View  Result Date: 03/02/2016 CLINICAL DATA:  Cough for 1 month EXAM: CHEST  2 VIEW COMPARISON:  None. FINDINGS: Heart and mediastinal contours are within normal limits. No focal opacities or effusions. No acute bony abnormality. IMPRESSION: No active cardiopulmonary disease. Electronically Signed   By: Rolm Baptise M.D.   On: 03/02/2016 08:15   Dg Abd 1 View  Result Date: 03/02/2016 CLINICAL DATA:  Left-sided flank pain. EXAM: ABDOMEN - 1 VIEW COMPARISON:  CT scan of May 29, 2015. FINDINGS: The bowel gas pattern is normal. Phleboliths are noted in the pelvis. No definite renal calculi are noted. IMPRESSION: No evidence of bowel obstruction or ileus. Electronically Signed   By: Marijo Conception, M.D.   On: 03/02/2016 08:16    Procedures Procedures (including critical care time) Procedures  Medications Ordered in ED  Medications  morphine 4 MG/ML injection 4 mg (4 mg Intravenous Given 03/02/16 0757)  ondansetron (ZOFRAN) injection 4 mg (4 mg Intravenous Given 03/02/16 0756)  sodium chloride 0.9 % bolus 1,000  mL (0 mLs Intravenous Stopped 03/02/16 1026)  HYDROmorphone (DILAUDID) injection 1 mg (1 mg Intravenous Given 03/02/16 0824)  oxyCODONE-acetaminophen (PERCOCET/ROXICET) 5-325 MG per tablet 2 tablet (2 tablets Oral Given 03/02/16 1006)  morphine 4 MG/ML injection 4 mg (4 mg Intravenous Given 03/02/16 1006)     Initial Impression / Assessment and Plan / ED Course  I have reviewed the triage vital signs and the nursing notes.  Pertinent labs & imaging results that were available during my care of  the patient were reviewed by me and considered in my medical decision making (see chart for details).     Pt w/ h/o kidney stones p/w sudden L flank pain radiating to groin similar to previous kidney stone pain. He was in mild distress due to pain but with stable vital signs on exam. Mild left lower quadrant pain but predominant pain was in his flank. Gave the patient morphine followed by Dilaudid and obtained above labs. Creatinine is 1.36 which is similar to previous. Normal CBC. UA contains large amount of blood but no evidence of infection. Change chest x-ray for cough which was normal. He has had no shortness of breath or chest pain to suggest acute process related to the cough. I did obtain KUB to evaluate for large stone and KUB was unremarkable. The patient has never required any urologic intervention as all of his previous stones have been small. His pain has been adequately controlled after a few doses of IV medications followed by Percocet. On reexamination he is comfortable and well-appearing, stating that he feels comfortable going home. Given his reassuring lab work and well appearance, I do not feel he needs any further imaging at this time. He already has strainer at home. Discussed supportive care and instructed to follow-up with his outpatient urologist. Provided with Percocet and Flomax to use at home. Reviewed controlled substance database prior to prescription. Extensively reviewed return precautions  including intractable pain, intractable vomiting, or fever. Patient voiced understanding and was discharged in satisfactory condition.  Final Clinical Impressions(s) / ED Diagnoses   Final diagnoses:  Kidney stone     New Prescriptions   OXYCODONE-ACETAMINOPHEN (PERCOCET) 5-325 MG TABLET    Take 1-2 tablets by mouth every 4 (four) hours as needed for severe pain.   TAMSULOSIN (FLOMAX) 0.4 MG CAPS CAPSULE    Take 1 capsule (0.4 mg total) by mouth daily.       Sharlett Iles, MD 03/02/16 [phone]

## 2016-03-02 NOTE — ED Triage Notes (Signed)
Pt having left sided flank pain and left sided groin pain since 6 am.  Pt states he has history of same.  Pt states he also has a nagging cough.

## 2016-03-02 NOTE — ED Notes (Signed)
Patient transported to X-ray 

## 2016-03-03 LAB — URINE CULTURE: CULTURE: NO GROWTH

## 2016-03-05 ENCOUNTER — Encounter (HOSPITAL_BASED_OUTPATIENT_CLINIC_OR_DEPARTMENT_OTHER): Payer: Self-pay | Admitting: *Deleted

## 2016-03-05 ENCOUNTER — Emergency Department (HOSPITAL_BASED_OUTPATIENT_CLINIC_OR_DEPARTMENT_OTHER)
Admission: EM | Admit: 2016-03-05 | Discharge: 2016-03-05 | Disposition: A | Discharge status: Home / Self Care | Payer: Medicare Other | Attending: Emergency Medicine | Admitting: Emergency Medicine

## 2016-03-05 ENCOUNTER — Emergency Department (HOSPITAL_BASED_OUTPATIENT_CLINIC_OR_DEPARTMENT_OTHER): Payer: Medicare Other

## 2016-03-05 DIAGNOSIS — I10 Essential (primary) hypertension: Secondary | ICD-10-CM | POA: Diagnosis not present

## 2016-03-05 DIAGNOSIS — K5900 Constipation, unspecified: Secondary | ICD-10-CM | POA: Insufficient documentation

## 2016-03-05 DIAGNOSIS — Z79899 Other long term (current) drug therapy: Secondary | ICD-10-CM | POA: Insufficient documentation

## 2016-03-05 DIAGNOSIS — N201 Calculus of ureter: Secondary | ICD-10-CM | POA: Insufficient documentation

## 2016-03-05 DIAGNOSIS — Z7984 Long term (current) use of oral hypoglycemic drugs: Secondary | ICD-10-CM | POA: Diagnosis not present

## 2016-03-05 DIAGNOSIS — R109 Unspecified abdominal pain: Secondary | ICD-10-CM

## 2016-03-05 DIAGNOSIS — E119 Type 2 diabetes mellitus without complications: Secondary | ICD-10-CM | POA: Diagnosis not present

## 2016-03-05 DIAGNOSIS — Z7982 Long term (current) use of aspirin: Secondary | ICD-10-CM | POA: Insufficient documentation

## 2016-03-05 LAB — CBC WITH DIFFERENTIAL/PLATELET
BASOS ABS: 0 10*3/uL (ref 0.0–0.1)
Basophils Relative: 0 %
EOS PCT: 1 %
Eosinophils Absolute: 0.1 10*3/uL (ref 0.0–0.7)
HEMATOCRIT: 35.8 % — AB (ref 39.0–52.0)
Hemoglobin: 12.2 g/dL — ABNORMAL LOW (ref 13.0–17.0)
LYMPHS ABS: 0.8 10*3/uL (ref 0.7–4.0)
LYMPHS PCT: 10 %
MCH: 32.4 pg (ref 26.0–34.0)
MCHC: 34.1 g/dL (ref 30.0–36.0)
MCV: 95 fL (ref 78.0–100.0)
MONO ABS: 0.7 10*3/uL (ref 0.1–1.0)
MONOS PCT: 9 %
NEUTROS ABS: 6.3 10*3/uL (ref 1.7–7.7)
Neutrophils Relative %: 80 %
PLATELETS: 136 10*3/uL — AB (ref 150–400)
RBC: 3.77 MIL/uL — ABNORMAL LOW (ref 4.22–5.81)
RDW: 12.8 % (ref 11.5–15.5)
WBC: 7.9 10*3/uL (ref 4.0–10.5)

## 2016-03-05 LAB — BASIC METABOLIC PANEL
ANION GAP: 9 (ref 5–15)
BUN: 25 mg/dL — AB (ref 6–20)
CALCIUM: 9.3 mg/dL (ref 8.9–10.3)
CO2: 26 mmol/L (ref 22–32)
Chloride: 101 mmol/L (ref 101–111)
Creatinine, Ser: 1.85 mg/dL — ABNORMAL HIGH (ref 0.61–1.24)
GFR calc Af Amer: 41 mL/min — ABNORMAL LOW (ref >60)
GFR calc non Af Amer: 36 mL/min — ABNORMAL LOW (ref >60)
GLUCOSE: 110 mg/dL — AB (ref 65–99)
Potassium: 4 mmol/L (ref 3.5–5.1)
Sodium: 136 mmol/L (ref 135–145)

## 2016-03-05 LAB — URINALYSIS, MICROSCOPIC (REFLEX)

## 2016-03-05 LAB — URINALYSIS, ROUTINE W REFLEX MICROSCOPIC
BILIRUBIN URINE: NEGATIVE
GLUCOSE, UA: NEGATIVE mg/dL
KETONES UR: NEGATIVE mg/dL
Leukocytes, UA: NEGATIVE
Nitrite: NEGATIVE
PROTEIN: 30 mg/dL — AB
Specific Gravity, Urine: 1.019 (ref 1.005–1.030)
pH: 6 (ref 5.0–8.0)

## 2016-03-05 MED ORDER — SODIUM CHLORIDE 0.9 % IV BOLUS (SEPSIS)
500.0000 mL | Freq: Once | INTRAVENOUS | Status: AC
Start: 2016-03-05 — End: 2016-03-05
  Administered 2016-03-05: 500 mL via INTRAVENOUS

## 2016-03-05 MED ORDER — HYDROMORPHONE HCL 1 MG/ML IJ SOLN
1.0000 mg | Freq: Once | INTRAMUSCULAR | Status: AC
  Administered 2016-03-05: 1 mg via INTRAVENOUS
  Filled 2016-03-05: qty 1

## 2016-03-05 MED ORDER — SODIUM CHLORIDE 0.9 % IV SOLN: INTRAVENOUS | Status: DC

## 2016-03-05 MED ORDER — ONDANSETRON HCL 4 MG/2ML IJ SOLN
4.0000 mg | Freq: Once | INTRAMUSCULAR | Status: AC
  Administered 2016-03-05: 4 mg via INTRAVENOUS
  Filled 2016-03-05: qty 2

## 2016-03-05 MED ORDER — POLYETHYLENE GLYCOL 3350 17 G PO PACK: 17.0000 g | PACK | Freq: Every day | ORAL | 0 refills | Status: AC

## 2016-03-05 MED ORDER — ONDANSETRON 4 MG PO TBDP: 4.0000 mg | ORAL_TABLET | Freq: Three times a day (TID) | ORAL | 1 refills | Status: AC | PRN

## 2016-03-05 MED ORDER — OXYCODONE-ACETAMINOPHEN 5-325 MG PO TABS: 1.0000 | ORAL_TABLET | Freq: Four times a day (QID) | ORAL | 0 refills | Status: DC | PRN

## 2016-03-05 MED FILL — OXYCODONE/APAP 5/325 MG TAB: 5-325 | 2 days supply | Qty: 20 | Fill #0

## 2016-03-05 MED FILL — POLYETHYLENE GLYCOL 3350: 15 days supply | Qty: 255 | Fill #0

## 2016-03-05 MED FILL — ONDANSETRON ODT 4 MG TABLET: 4 | 4 days supply | Qty: 12 | Fill #0

## 2016-03-05 NOTE — ED Triage Notes (Addendum)
Left flank pain. He was diagnosed with a kidney stone 3 days ago. He was told to return if the stone did not pass and it has not. He is also constipated from taking the narcotic for pain.

## 2016-03-05 NOTE — ED Provider Notes (Addendum)
[facility] DEPT [facility] Provider Note   CSN: [id number] Arrival date & time: 03/05/16  1249     History   Chief Complaint Chief Complaint  Patient presents with  . Flank Pain    HPI ANNIE CONSOLO is a 71 y.o. male.  Patient with left-sided flank pain since Monday. Evaluated February 5 with KUB without evidence of any stone. Patient states pain feels like passed kidney stones. Urinalysis on that day did show some hematuria. Patient still has persistent left CVA left flank and left groin pain. The flank pain radiates to the groin. No fevers. Patient's been taking Percocet for the pain. Now having some difficulty with constipation.      Past Medical History:  Diagnosis Date  . BBB (bundle branch block)    Left  . BPH (benign prostatic hyperplasia)   . Diabetes mellitus without complication ([place])   . Gout   . Hypertension   . Kidney stones   . Renal disorder     There are no active problems to display for this patient.   Past Surgical History:  Procedure Laterality Date  . FOOT SURGERY         Home Medications    Prior to Admission medications   Medication Sig Start Date End Date Taking? Authorizing Provider  allopurinol (ZYLOPRIM) 300 MG tablet Take 300 mg by mouth daily.    Historical Provider, MD  aspirin 81 MG tablet Take 81 mg by mouth daily.    Historical Provider, MD  atorvastatin (LIPITOR) 40 MG tablet Take 40 mg by mouth daily.    Historical Provider, MD  benazepril (LOTENSIN) 20 MG tablet Take 20 mg by mouth daily.    Historical Provider, MD  metFORMIN (GLUCOPHAGE) 500 MG tablet Take 2,000 mg by mouth 2 (two) times daily with a meal.    Historical Provider, MD  oxyCODONE-acetaminophen (PERCOCET) 5-325 MG tablet Take 1-2 tablets by mouth every 4 (four) hours as needed for severe pain. 03/02/16   Sharlett Iles, MD  tamsulosin (FLOMAX) 0.4 MG CAPS capsule Take 1 capsule (0.4 mg total) by mouth daily. 03/02/16   Sharlett Iles, MD    Family  History No family history on file.  Social History Social History  Substance Use Topics  . Smoking status: Never Smoker  . Smokeless tobacco: Never Used  . Alcohol use Yes     Comment: one beer a day     Allergies   Patient has no known allergies.   Review of Systems Review of Systems  Constitutional: Negative for fever.  HENT: Negative for congestion.   Eyes: Negative for redness.  Respiratory: Positive for cough. Negative for shortness of breath.   Cardiovascular: Negative for chest pain.  Gastrointestinal: Positive for abdominal pain and constipation.  Genitourinary: Negative for dysuria and hematuria.  Musculoskeletal: Positive for back pain.  Skin: Negative for rash.  Neurological: Negative for headaches.  Psychiatric/Behavioral: Negative for confusion.     Physical Exam Updated Vital Signs BP 158/92   Pulse 83   Temp 98 F (36.7 C) (Oral)   Resp 20   Ht 5\' 7"  (1.702 m)   Wt 86.6 kg   SpO2 99%   BMI 29.91 kg/m   Physical Exam  Constitutional: He is oriented to person, place, and time. He appears well-developed and well-nourished. No distress.  HENT:  Head: Normocephalic and atraumatic.  Mouth/Throat: Oropharynx is clear and moist.  Eyes: Conjunctivae and EOM are normal. Pupils are equal, round, and reactive to  light.  Neck: Normal range of motion. Neck supple.  Cardiovascular: Normal rate, regular rhythm and normal heart sounds.   Pulmonary/Chest: Effort normal and breath sounds normal.  Abdominal: Soft. Bowel sounds are normal.  Musculoskeletal: Normal range of motion.  Neurological: He is alert and oriented to person, place, and time. No cranial nerve deficit. He exhibits normal muscle tone. Coordination normal.  Skin: Skin is warm.  Nursing note and vitals reviewed.    ED Treatments / Results  Labs (all labs ordered are listed, but only abnormal results are displayed) Labs Reviewed  URINALYSIS, ROUTINE W REFLEX MICROSCOPIC - Abnormal; Notable  for the following:       Result Value   Hgb urine dipstick SMALL (*)    Protein, ur 30 (*)    All other components within normal limits  URINALYSIS, MICROSCOPIC (REFLEX) - Abnormal; Notable for the following:    Bacteria, UA RARE (*)    Squamous Epithelial / LPF 0-5 (*)    All other components within normal limits  CBC WITH DIFFERENTIAL/PLATELET  BASIC METABOLIC PANEL    EKG  EKG Interpretation None       Radiology No results found.  Procedures Procedures (including critical care time)  Medications Ordered in ED Medications  0.9 %  sodium chloride infusion (not administered)  sodium chloride 0.9 % bolus 500 mL (not administered)  ondansetron (ZOFRAN) injection 4 mg (not administered)  HYDROmorphone (DILAUDID) injection 1 mg (not administered)     Initial Impression / Assessment and Plan / ED Course  I have reviewed the triage vital signs and the nursing notes.  Pertinent labs & imaging results that were available during my care of the patient were reviewed by me and considered in my medical decision making (see chart for details).     Patient with a history of kidney stones in the past. Was seen February 5 had KUB without evidence of stones. Patient has now had persistent left flank pain since Monday.  CT renal stone study will be done to confirm the presence of a ureteral stone. Patient nontoxic no acute distress.  Final Clinical Impressions(s) / ED Diagnoses   Final diagnoses:  Left flank pain    New Prescriptions New Prescriptions   No medications on file     Fredia Sorrow, MD 03/05/16 1449   Addendum:. CT scan consistent with a distal left ureter 2 mm stone. No complicating factors. Will treat pain and nausea and have him follow-up urology.   Fredia Sorrow, MD 03/05/16 [phone]

## 2016-03-05 NOTE — Discharge Instructions (Signed)
CT scan consistent with left ureteral stone. Take pain medicine as needed. Take antinausea medicine as needed. Take the MiraLAX as needed for the constipation. Call urology for follow-up. Return for any new or worse symptoms.

## 2018-02-11 ENCOUNTER — Emergency Department (HOSPITAL_BASED_OUTPATIENT_CLINIC_OR_DEPARTMENT_OTHER): Payer: Medicare Other

## 2018-02-11 ENCOUNTER — Emergency Department (HOSPITAL_BASED_OUTPATIENT_CLINIC_OR_DEPARTMENT_OTHER)
Admission: EM | Admit: 2018-02-11 | Discharge: 2018-02-11 | Disposition: A | Discharge status: Home / Self Care | Payer: Medicare Other | Attending: Emergency Medicine | Admitting: Emergency Medicine

## 2018-02-11 ENCOUNTER — Other Ambulatory Visit: Payer: Self-pay

## 2018-02-11 ENCOUNTER — Encounter (HOSPITAL_BASED_OUTPATIENT_CLINIC_OR_DEPARTMENT_OTHER): Payer: Self-pay | Admitting: *Deleted

## 2018-02-11 DIAGNOSIS — R1031 Right lower quadrant pain: Secondary | ICD-10-CM | POA: Diagnosis present

## 2018-02-11 DIAGNOSIS — Z7982 Long term (current) use of aspirin: Secondary | ICD-10-CM | POA: Diagnosis not present

## 2018-02-11 DIAGNOSIS — Z7984 Long term (current) use of oral hypoglycemic drugs: Secondary | ICD-10-CM | POA: Insufficient documentation

## 2018-02-11 DIAGNOSIS — N23 Unspecified renal colic: Secondary | ICD-10-CM | POA: Diagnosis not present

## 2018-02-11 DIAGNOSIS — E119 Type 2 diabetes mellitus without complications: Secondary | ICD-10-CM | POA: Diagnosis not present

## 2018-02-11 DIAGNOSIS — I10 Essential (primary) hypertension: Secondary | ICD-10-CM | POA: Diagnosis not present

## 2018-02-11 LAB — URINALYSIS, ROUTINE W REFLEX MICROSCOPIC
Bilirubin Urine: NEGATIVE
GLUCOSE, UA: 100 mg/dL — AB
Hgb urine dipstick: NEGATIVE
Ketones, ur: NEGATIVE mg/dL
LEUKOCYTES UA: NEGATIVE
Nitrite: NEGATIVE
Protein, ur: NEGATIVE mg/dL
SPECIFIC GRAVITY, URINE: 1.02 (ref 1.005–1.030)
pH: 6 (ref 5.0–8.0)

## 2018-02-11 LAB — CBC WITH DIFFERENTIAL/PLATELET
Abs Immature Granulocytes: 0.04 10*3/uL (ref 0.00–0.07)
BASOS ABS: 0.1 10*3/uL (ref 0.0–0.1)
Basophils Relative: 0 %
EOS PCT: 0 %
Eosinophils Absolute: 0 10*3/uL (ref 0.0–0.5)
HEMATOCRIT: 41.3 % (ref 39.0–52.0)
HEMOGLOBIN: 13.6 g/dL (ref 13.0–17.0)
Immature Granulocytes: 0 %
LYMPHS ABS: 1 10*3/uL (ref 0.7–4.0)
LYMPHS PCT: 8 %
MCH: 30.8 pg (ref 26.0–34.0)
MCHC: 32.9 g/dL (ref 30.0–36.0)
MCV: 93.4 fL (ref 80.0–100.0)
MONOS PCT: 3 %
Monocytes Absolute: 0.4 10*3/uL (ref 0.1–1.0)
Neutro Abs: 10.4 10*3/uL — ABNORMAL HIGH (ref 1.7–7.7)
Neutrophils Relative %: 89 %
Platelets: 152 10*3/uL (ref 150–400)
RBC: 4.42 MIL/uL (ref 4.22–5.81)
RDW: 12.4 % (ref 11.5–15.5)
WBC: 11.8 10*3/uL — ABNORMAL HIGH (ref 4.0–10.5)
nRBC: 0 % (ref 0.0–0.2)

## 2018-02-11 LAB — BASIC METABOLIC PANEL
Anion gap: 8 (ref 5–15)
BUN: 21 mg/dL (ref 8–23)
CALCIUM: 9.2 mg/dL (ref 8.9–10.3)
CHLORIDE: 106 mmol/L (ref 98–111)
CO2: 22 mmol/L (ref 22–32)
CREATININE: 1.46 mg/dL — AB (ref 0.61–1.24)
GFR calc Af Amer: 55 mL/min — ABNORMAL LOW (ref >60)
GFR calc non Af Amer: 48 mL/min — ABNORMAL LOW (ref >60)
GLUCOSE: 199 mg/dL — AB (ref 70–99)
Potassium: 3.7 mmol/L (ref 3.5–5.1)
Sodium: 136 mmol/L (ref 135–145)

## 2018-02-11 MED ORDER — MORPHINE SULFATE (PF) 4 MG/ML IV SOLN
4.0000 mg | Freq: Once | INTRAVENOUS | Status: AC
  Administered 2018-02-11: 4 mg via INTRAVENOUS
  Filled 2018-02-11: qty 1

## 2018-02-11 MED ORDER — TAMSULOSIN HCL 0.4 MG PO CAPS: 0.4000 mg | ORAL_CAPSULE | Freq: Every day | ORAL | 0 refills | Status: AC

## 2018-02-11 MED ORDER — OXYCODONE-ACETAMINOPHEN 5-325 MG PO TABS: 1.0000 | ORAL_TABLET | Freq: Four times a day (QID) | ORAL | 0 refills | Status: DC | PRN

## 2018-02-11 MED ORDER — SODIUM CHLORIDE 0.9 % IV BOLUS
30.0000 mL/kg | Freq: Once | INTRAVENOUS | Status: AC
  Administered 2018-02-11: 1000 mL via INTRAVENOUS

## 2018-02-11 MED ORDER — KETOROLAC TROMETHAMINE 30 MG/ML IJ SOLN
15.0000 mg | Freq: Once | INTRAMUSCULAR | Status: AC
  Administered 2018-02-11: 15 mg via INTRAVENOUS
  Filled 2018-02-11: qty 1

## 2018-02-11 MED FILL — OXYCODONE-ACETAMINOPHEN 5-3: 5-325 | 2 days supply | Qty: 15 | Fill #0

## 2018-02-11 MED FILL — TAMSULOSIN HCL 0.4 MG CAP: 0.4 | 15 days supply | Qty: 15 | Fill #0

## 2018-02-11 NOTE — ED Triage Notes (Signed)
Rt flank pain x 3 hours  No blood in urine  Denies n/v  Hx kidney stones

## 2018-02-11 NOTE — ED Notes (Signed)
Pt teaching provided on medications that may cause drowsiness. Pt instructed not to drive or operate heavy machinery while taking the prescribed medication. Pt verbalized understanding.   Pt instructed that he can not drive home due to medications received. Pt stated he was going to have his wife pick up. Pt discharged out to the lobby to await for family.

## 2018-02-11 NOTE — Discharge Instructions (Signed)
You were seen in the emergency department for right flank and right lower quadrant abdominal pain.  You have a 2 mm stone just above the bladder.  You should continue to stay well-hydrated and we are prescribing some pain medicine and Flomax to help with passage of the stone.  You should let your primary care doctor know what is going on and were also including the number for urologist if you have any problems.  Please return if any concerns including fever or uncontrolled pain.

## 2018-02-11 NOTE — ED Provider Notes (Signed)
[facility] EMERGENCY DEPARTMENT Provider Note   CSN: [id number] Arrival date & time: 02/11/18  1222     History   Chief Complaint Chief Complaint  Patient presents with  . Flank Pain    HPI ARNE SCHLENDER is a 73 y.o. male.  He is presenting with acute right flank and right lower quadrant pain about an hour or 2 prior to arrival.  No trauma.  He rates the pain is 8 out of 10 and is colicky in nature.  Is associated with some urinary frequency.  He has had kidney stones before and he says this feels like that.  A little bit of nausea no vomiting.  No fevers or chills no cough no chest pain no shortness of breath.  He is taken nothing for it.  The history is provided by the patient.  Flank Pain  This is a recurrent problem. The current episode started 1 to 2 hours ago. The problem occurs constantly. The problem has not changed since onset.Associated symptoms include abdominal pain. Pertinent negatives include no chest pain, no headaches and no shortness of breath. Nothing aggravates the symptoms. Nothing relieves the symptoms. He has tried nothing for the symptoms. The treatment provided no relief.    Past Medical History:  Diagnosis Date  . BBB (bundle branch block)    Left  . BPH (benign prostatic hyperplasia)   . Diabetes mellitus without complication ([place])   . Gout   . Hypertension   . Kidney stones   . Renal disorder     There are no active problems to display for this patient.   Past Surgical History:  Procedure Laterality Date  . FOOT SURGERY          Home Medications    Prior to Admission medications   Medication Sig Start Date End Date Taking? Authorizing Provider  allopurinol (ZYLOPRIM) 300 MG tablet Take 300 mg by mouth daily.    [provider]  aspirin 81 MG tablet Take 81 mg by mouth daily.    [provider]  atorvastatin (LIPITOR) 40 MG tablet Take 40 mg by mouth daily.    [provider]  benazepril (LOTENSIN)  20 MG tablet Take 20 mg by mouth daily.    [provider]  metFORMIN (GLUCOPHAGE) 500 MG tablet Take 2,000 mg by mouth 2 (two) times daily with a meal.    [provider]  ondansetron (ZOFRAN ODT) 4 MG disintegrating tablet Take 1 tablet (4 mg total) by mouth every 8 (eight) hours as needed for nausea or vomiting. 03/05/16   Fredia Sorrow, MD  oxyCODONE-acetaminophen (PERCOCET) 5-325 MG tablet Take 1-2 tablets by mouth every 4 (four) hours as needed for severe pain. 03/02/16   Little, Wenda Overland, MD  oxyCODONE-acetaminophen (PERCOCET/ROXICET) 5-325 MG tablet Take 1-2 tablets by mouth every 6 (six) hours as needed for severe pain. 03/05/16   Fredia Sorrow, MD  polyethylene glycol [facility]) packet Take 17 g by mouth daily. 03/05/16   Fredia Sorrow, MD  tamsulosin (FLOMAX) 0.4 MG CAPS capsule Take 1 capsule (0.4 mg total) by mouth daily. 03/02/16   Little, Wenda Overland, MD    Family History No family history on file.  Social History Social History   Tobacco Use  . Smoking status: Never Smoker  . Smokeless tobacco: Never Used  Substance Use Topics  . Alcohol use: Yes    Comment: one beer a day  . Drug use: No     Allergies  Patient has no known allergies.   Review of Systems Review of Systems  Constitutional: Negative for fever.  HENT: Negative for sore throat.   Eyes: Negative for visual disturbance.  Respiratory: Negative for shortness of breath.   Cardiovascular: Negative for chest pain.  Gastrointestinal: Positive for abdominal pain and nausea. Negative for vomiting.  Genitourinary: Positive for flank pain and frequency. Negative for dysuria and hematuria.  Musculoskeletal: Positive for back pain. Negative for neck pain.  Skin: Negative for rash.  Neurological: Negative for headaches.     Physical Exam Updated Vital Signs BP (!) 175/80   Pulse 86   Temp 98.1 F (36.7 C) (Oral)   Resp 18   Ht 5\' 7"  (1.702 m)   Wt 93 kg   SpO2 99%   BMI 32.11  kg/m   Physical Exam Vitals signs and nursing note reviewed.  Constitutional:      Appearance: He is well-developed.  HENT:     Head: Normocephalic and atraumatic.  Eyes:     Conjunctiva/sclera: Conjunctivae normal.  Neck:     Musculoskeletal: Neck supple.  Cardiovascular:     Rate and Rhythm: Normal rate and regular rhythm.     Heart sounds: No murmur.  Pulmonary:     Effort: Pulmonary effort is normal. No respiratory distress.     Breath sounds: Normal breath sounds.  Abdominal:     Palpations: Abdomen is soft.     Tenderness: There is no abdominal tenderness.  Musculoskeletal: Normal range of motion.        General: No tenderness or signs of injury.     Right lower leg: No edema.     Left lower leg: No edema.  Skin:    General: Skin is warm and dry.     Capillary Refill: Capillary refill takes less than 2 seconds.  Neurological:     General: No focal deficit present.     Mental Status: He is alert and oriented to person, place, and time.     Sensory: No sensory deficit.     Motor: No weakness.      ED Treatments / Results  Labs (all labs ordered are listed, but only abnormal results are displayed) Labs Reviewed  BASIC METABOLIC PANEL - Abnormal; Notable for the following components:      Result Value   Glucose, Bld 199 (*)    Creatinine, Ser 1.46 (*)    GFR calc non Af Amer 48 (*)    GFR calc Af Amer 55 (*)    All other components within normal limits  CBC WITH DIFFERENTIAL/PLATELET - Abnormal; Notable for the following components:   WBC 11.8 (*)    Neutro Abs 10.4 (*)    All other components within normal limits  URINALYSIS, ROUTINE W REFLEX MICROSCOPIC - Abnormal; Notable for the following components:   Glucose, UA 100 (*)    All other components within normal limits    EKG None  Radiology Ct Renal Stone Study  Result Date: 02/11/2018 CLINICAL DATA:  Acute onset of right flank pain this morning. History of kidney stones. EXAM: CT ABDOMEN AND PELVIS  WITHOUT CONTRAST TECHNIQUE: Multidetector CT imaging of the abdomen and pelvis was performed following the standard protocol without IV contrast. COMPARISON:  03/05/2016 FINDINGS: Lower chest: No acute abnormality. Hepatobiliary: No focal liver abnormality is seen. No gallstones, gallbladder wall thickening, or biliary dilatation. Pancreas: Unremarkable. No pancreatic ductal dilatation or surrounding inflammatory changes. Spleen: Normal in size without focal abnormality. Adrenals/Urinary Tract: No  adrenal masses. Kidneys are normal in size, orientation and position. Mild right hydronephrosis with right perinephric stranding. Mild dilation of the right ureter. 2 mm stone at the right ureterovesicular junction. No left hydronephrosis. Normal left ureter. Small nonobstructing stones in the right kidney. No left intrarenal stones. 2 cm lower pole low-density mass on the right consistent with a cyst stable from the prior CT. Bladder is unremarkable. Stomach/Bowel: Stomach is within normal limits. Appendix appears normal. No evidence of bowel wall thickening, distention, or inflammatory changes. Vascular/Lymphatic: Aortic atherosclerosis. No enlarged abdominal or pelvic lymph nodes. Reproductive: Unremarkable. Other: No abdominal wall hernia or abnormality. No abdominopelvic ascites. Musculoskeletal: No fracture or acute finding. No osteoblastic or osteolytic lesions. IMPRESSION: 1. 2 mm stone at the right ureterovesicular junction leads to mild right hydroureteronephrosis and right perinephric stranding. 2. No other acute finding. 3. Small nonobstructing stones within the right kidney. 4. Aortic atherosclerosis. Electronically Signed   By: Lajean Manes M.D.   On: 02/11/2018 13:23    Procedures Procedures (including critical care time)  Medications Ordered in ED Medications  morphine 4 MG/ML injection 4 mg (has no administration in time range)  ketorolac (TORADOL) 30 MG/ML injection 15 mg (has no administration  in time range)  sodium chloride 0.9 % bolus 2,790 mL (has no administration in time range)     Initial Impression / Assessment and Plan / ED Course  I have reviewed the triage vital signs and the nursing notes.  Pertinent labs & imaging results that were available during my care of the patient were reviewed by me and considered in my medical decision making (see chart for details).  Clinical Course as of Feb 11 1725  Fri Feb 11, 2018  1328 Patient here with typical right kidney stone pain with a history of same.  His lab work is unremarkable and he has a 2 mm distal stone by CT.   [MB]    Clinical Course User Index [MB] Hayden Rasmussen, MD    Final Clinical Impressions(s) / ED Diagnoses   Final diagnoses:  Ureteral colic    ED Discharge Orders         Ordered    oxyCODONE-acetaminophen (PERCOCET/ROXICET) 5-325 MG tablet  Every 6 hours PRN     02/11/18 1418    tamsulosin (FLOMAX) 0.4 MG CAPS capsule  Daily     02/11/18 1418           Hayden Rasmussen, MD 02/11/18 1727

## 2019-02-07 DIAGNOSIS — D126 Benign neoplasm of colon, unspecified: Secondary | ICD-10-CM | POA: Diagnosis not present

## 2019-02-07 DIAGNOSIS — K648 Other hemorrhoids: Secondary | ICD-10-CM | POA: Diagnosis not present

## 2019-02-07 DIAGNOSIS — D125 Benign neoplasm of sigmoid colon: Secondary | ICD-10-CM | POA: Diagnosis not present

## 2019-02-07 DIAGNOSIS — Z1211 Encounter for screening for malignant neoplasm of colon: Secondary | ICD-10-CM | POA: Diagnosis not present

## 2019-02-07 DIAGNOSIS — Z86010 Personal history of colonic polyps: Secondary | ICD-10-CM | POA: Diagnosis not present

## 2019-02-07 DIAGNOSIS — D120 Benign neoplasm of cecum: Secondary | ICD-10-CM | POA: Diagnosis not present

## 2019-02-07 DIAGNOSIS — D122 Benign neoplasm of ascending colon: Secondary | ICD-10-CM | POA: Diagnosis not present

## 2019-02-07 DIAGNOSIS — K5730 Diverticulosis of large intestine without perforation or abscess without bleeding: Secondary | ICD-10-CM | POA: Diagnosis not present

## 2019-06-27 DIAGNOSIS — E538 Deficiency of other specified B group vitamins: Secondary | ICD-10-CM | POA: Diagnosis not present

## 2019-06-27 DIAGNOSIS — E7800 Pure hypercholesterolemia, unspecified: Secondary | ICD-10-CM | POA: Diagnosis not present

## 2019-06-27 DIAGNOSIS — M1A0720 Idiopathic chronic gout, left ankle and foot, without tophus (tophi): Secondary | ICD-10-CM | POA: Diagnosis not present

## 2019-06-27 DIAGNOSIS — E119 Type 2 diabetes mellitus without complications: Secondary | ICD-10-CM | POA: Diagnosis not present

## 2019-06-27 DIAGNOSIS — E559 Vitamin D deficiency, unspecified: Secondary | ICD-10-CM | POA: Diagnosis not present

## 2019-06-27 DIAGNOSIS — N1831 Chronic kidney disease, stage 3a: Secondary | ICD-10-CM | POA: Diagnosis not present

## 2019-06-28 DIAGNOSIS — E669 Obesity, unspecified: Secondary | ICD-10-CM | POA: Diagnosis not present

## 2019-06-28 DIAGNOSIS — I129 Hypertensive chronic kidney disease with stage 1 through stage 4 chronic kidney disease, or unspecified chronic kidney disease: Secondary | ICD-10-CM | POA: Diagnosis not present

## 2019-06-28 DIAGNOSIS — E559 Vitamin D deficiency, unspecified: Secondary | ICD-10-CM | POA: Diagnosis not present

## 2019-06-28 DIAGNOSIS — N1831 Chronic kidney disease, stage 3a: Secondary | ICD-10-CM | POA: Diagnosis not present

## 2019-06-28 DIAGNOSIS — D61818 Other pancytopenia: Secondary | ICD-10-CM | POA: Diagnosis not present

## 2019-06-28 DIAGNOSIS — E119 Type 2 diabetes mellitus without complications: Secondary | ICD-10-CM | POA: Diagnosis not present

## 2019-06-28 DIAGNOSIS — E538 Deficiency of other specified B group vitamins: Secondary | ICD-10-CM | POA: Diagnosis not present

## 2019-06-28 DIAGNOSIS — D696 Thrombocytopenia, unspecified: Secondary | ICD-10-CM | POA: Diagnosis not present

## 2019-06-28 DIAGNOSIS — E7800 Pure hypercholesterolemia, unspecified: Secondary | ICD-10-CM | POA: Diagnosis not present

## 2019-06-28 DIAGNOSIS — M1A0720 Idiopathic chronic gout, left ankle and foot, without tophus (tophi): Secondary | ICD-10-CM | POA: Diagnosis not present

## 2020-01-04 DIAGNOSIS — H524 Presbyopia: Secondary | ICD-10-CM | POA: Diagnosis not present

## 2020-01-04 DIAGNOSIS — E119 Type 2 diabetes mellitus without complications: Secondary | ICD-10-CM | POA: Diagnosis not present

## 2020-01-04 DIAGNOSIS — H43813 Vitreous degeneration, bilateral: Secondary | ICD-10-CM | POA: Diagnosis not present

## 2020-01-04 DIAGNOSIS — H25013 Cortical age-related cataract, bilateral: Secondary | ICD-10-CM | POA: Diagnosis not present

## 2020-01-04 DIAGNOSIS — H903 Sensorineural hearing loss, bilateral: Secondary | ICD-10-CM | POA: Diagnosis not present

## 2020-01-04 DIAGNOSIS — H52203 Unspecified astigmatism, bilateral: Secondary | ICD-10-CM | POA: Diagnosis not present

## 2020-01-04 DIAGNOSIS — H2513 Age-related nuclear cataract, bilateral: Secondary | ICD-10-CM | POA: Diagnosis not present

## 2020-01-04 DIAGNOSIS — H40003 Preglaucoma, unspecified, bilateral: Secondary | ICD-10-CM | POA: Diagnosis not present

## 2020-01-04 DIAGNOSIS — H5213 Myopia, bilateral: Secondary | ICD-10-CM | POA: Diagnosis not present

## 2020-01-04 DIAGNOSIS — H5111 Convergence insufficiency: Secondary | ICD-10-CM | POA: Diagnosis not present

## 2020-01-04 DIAGNOSIS — Z7984 Long term (current) use of oral hypoglycemic drugs: Secondary | ICD-10-CM | POA: Diagnosis not present

## 2020-03-15 DIAGNOSIS — I10 Essential (primary) hypertension: Secondary | ICD-10-CM | POA: Diagnosis not present

## 2020-03-15 DIAGNOSIS — E538 Deficiency of other specified B group vitamins: Secondary | ICD-10-CM | POA: Diagnosis not present

## 2020-03-15 DIAGNOSIS — R350 Frequency of micturition: Secondary | ICD-10-CM | POA: Diagnosis not present

## 2020-03-15 DIAGNOSIS — E119 Type 2 diabetes mellitus without complications: Secondary | ICD-10-CM | POA: Diagnosis not present

## 2020-03-15 DIAGNOSIS — N1831 Chronic kidney disease, stage 3a: Secondary | ICD-10-CM | POA: Diagnosis not present

## 2020-03-15 DIAGNOSIS — M1A0720 Idiopathic chronic gout, left ankle and foot, without tophus (tophi): Secondary | ICD-10-CM | POA: Diagnosis not present

## 2020-03-15 DIAGNOSIS — N401 Enlarged prostate with lower urinary tract symptoms: Secondary | ICD-10-CM | POA: Diagnosis not present

## 2020-03-15 DIAGNOSIS — E7800 Pure hypercholesterolemia, unspecified: Secondary | ICD-10-CM | POA: Diagnosis not present

## 2020-03-18 DIAGNOSIS — N1831 Chronic kidney disease, stage 3a: Secondary | ICD-10-CM | POA: Diagnosis not present

## 2020-03-18 DIAGNOSIS — E538 Deficiency of other specified B group vitamins: Secondary | ICD-10-CM | POA: Diagnosis not present

## 2020-03-18 DIAGNOSIS — I129 Hypertensive chronic kidney disease with stage 1 through stage 4 chronic kidney disease, or unspecified chronic kidney disease: Secondary | ICD-10-CM | POA: Diagnosis not present

## 2020-03-18 DIAGNOSIS — N401 Enlarged prostate with lower urinary tract symptoms: Secondary | ICD-10-CM | POA: Diagnosis not present

## 2020-03-18 DIAGNOSIS — E559 Vitamin D deficiency, unspecified: Secondary | ICD-10-CM | POA: Diagnosis not present

## 2020-03-18 DIAGNOSIS — Z0000 Encounter for general adult medical examination without abnormal findings: Secondary | ICD-10-CM | POA: Diagnosis not present

## 2020-03-18 DIAGNOSIS — M8589 Other specified disorders of bone density and structure, multiple sites: Secondary | ICD-10-CM | POA: Diagnosis not present

## 2020-03-18 DIAGNOSIS — E669 Obesity, unspecified: Secondary | ICD-10-CM | POA: Diagnosis not present

## 2020-03-18 DIAGNOSIS — D696 Thrombocytopenia, unspecified: Secondary | ICD-10-CM | POA: Diagnosis not present

## 2020-03-18 DIAGNOSIS — E1122 Type 2 diabetes mellitus with diabetic chronic kidney disease: Secondary | ICD-10-CM | POA: Diagnosis not present

## 2020-03-18 DIAGNOSIS — E7800 Pure hypercholesterolemia, unspecified: Secondary | ICD-10-CM | POA: Diagnosis not present

## 2020-03-18 DIAGNOSIS — D61818 Other pancytopenia: Secondary | ICD-10-CM | POA: Diagnosis not present

## 2020-03-18 DIAGNOSIS — M1A0720 Idiopathic chronic gout, left ankle and foot, without tophus (tophi): Secondary | ICD-10-CM | POA: Diagnosis not present

## 2020-04-10 IMAGING — CT CT RENAL STONE PROTOCOL
1 of 2 series · 15 of 32 positions shown, 19 images · non-contrast
Comparison: 03/05/2016

CLINICAL DATA: Acute onset of right flank pain this morning.
History of kidney stones.

EXAM:
CT ABDOMEN AND PELVIS WITHOUT CONTRAST
TECHNIQUE: Multidetector CT imaging of the abdomen and pelvis was performed
following the standard protocol without IV contrast.

[Series 2: axial st · axial · 0.84mm/px · z∈[+488,+944]mm · 15 of 99 slices shown, 19 images]
[im 4/99  soft-tissue]
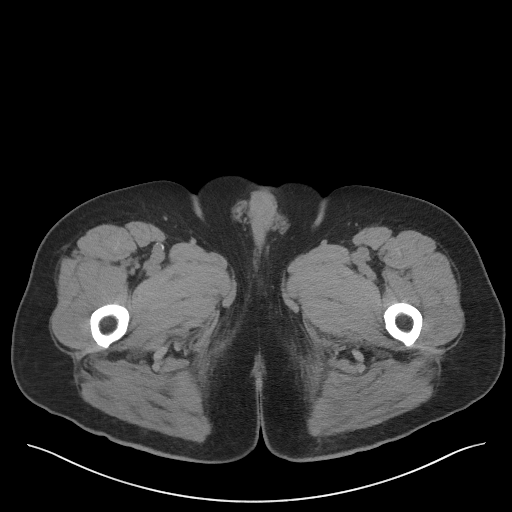
[im 4/99  bone]
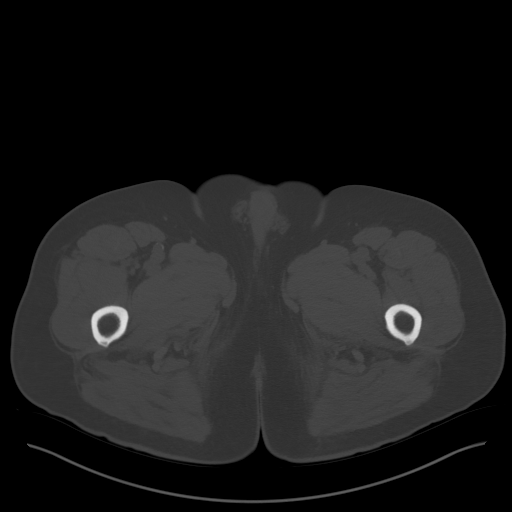
[im 12/99  soft-tissue]
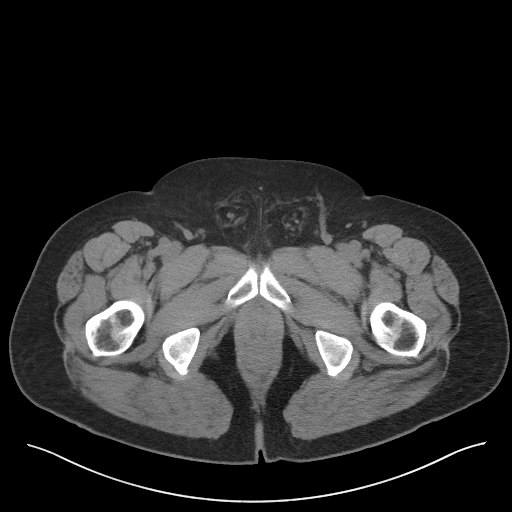
[im 20/99  soft-tissue]
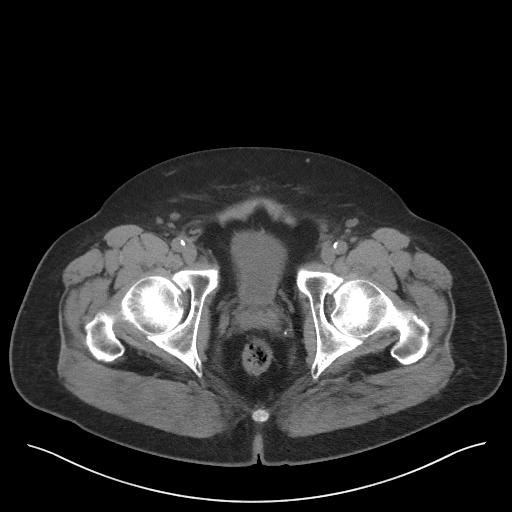
[im 28/99  soft-tissue]
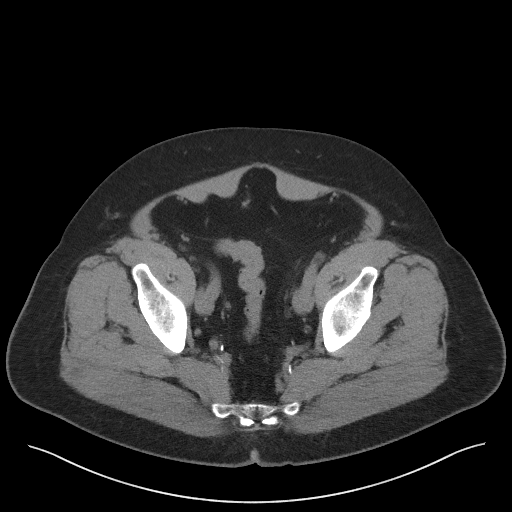
[im 36/99  soft-tissue]
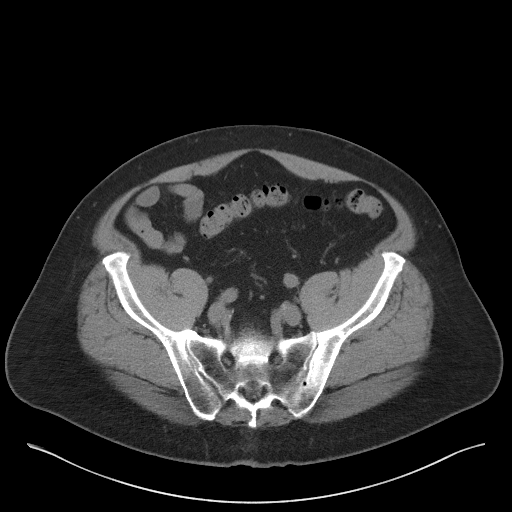
[im 44/99  soft-tissue]
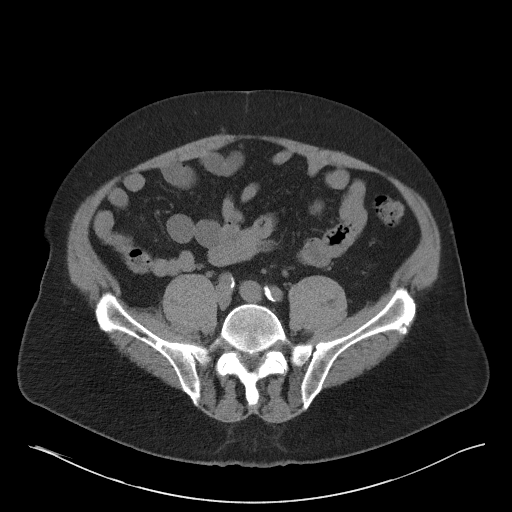
[im 51/99  soft-tissue]
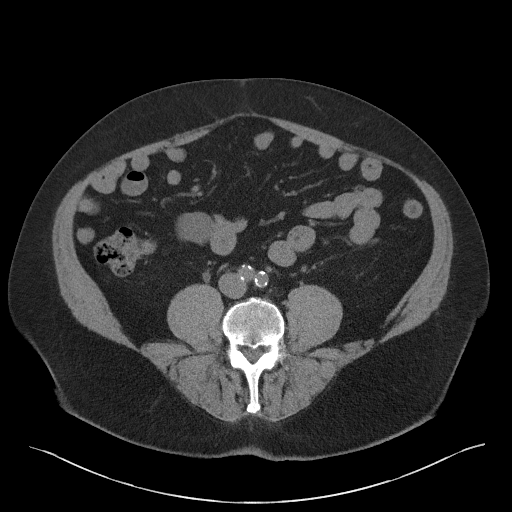
[im 55/99  soft-tissue]
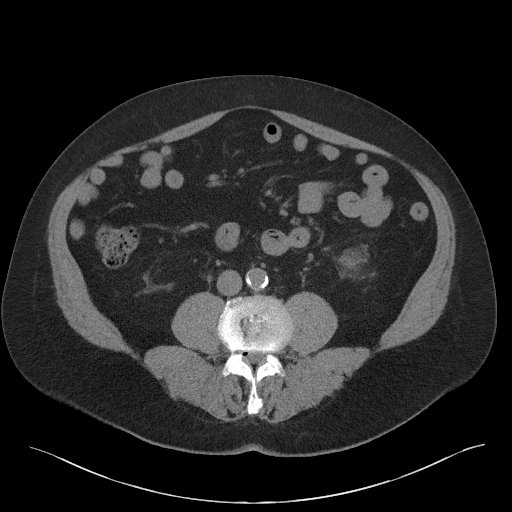
[im 63/99  soft-tissue]
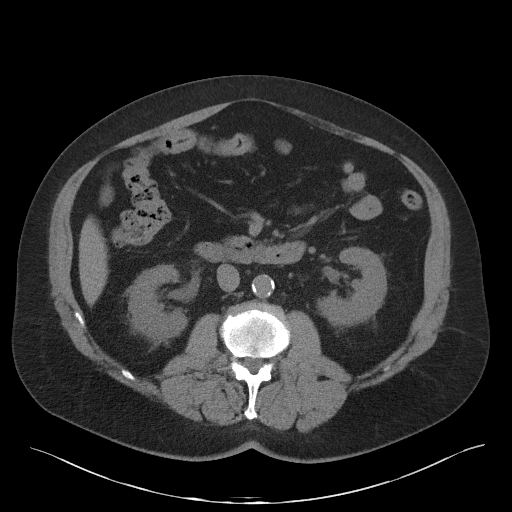
[im 63/99  bone]
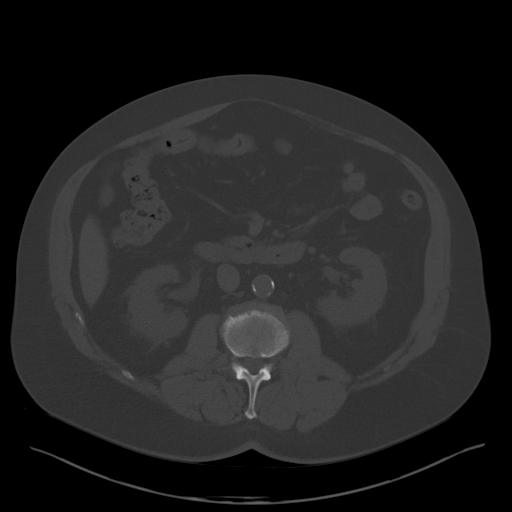
[im 71/99  soft-tissue]
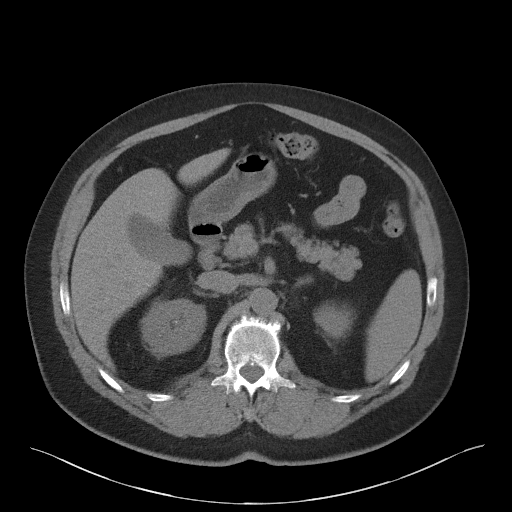
[im 79/99  soft-tissue]
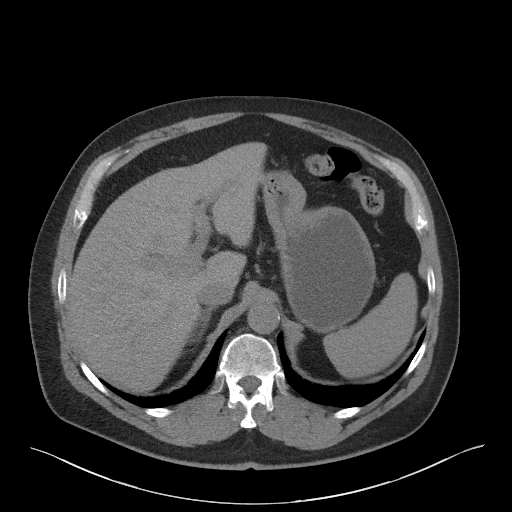
[im 83/99  lung]
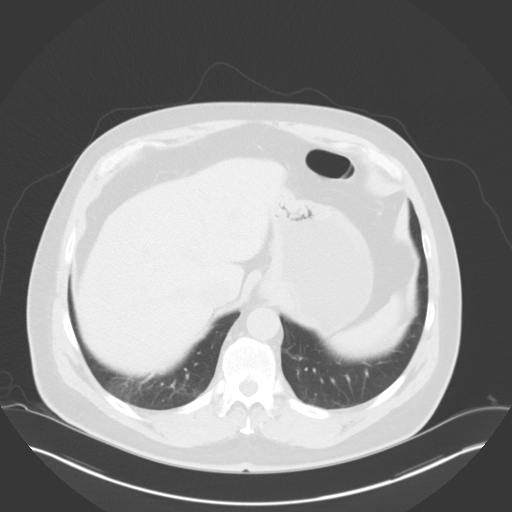
[im 87/99  soft-tissue]
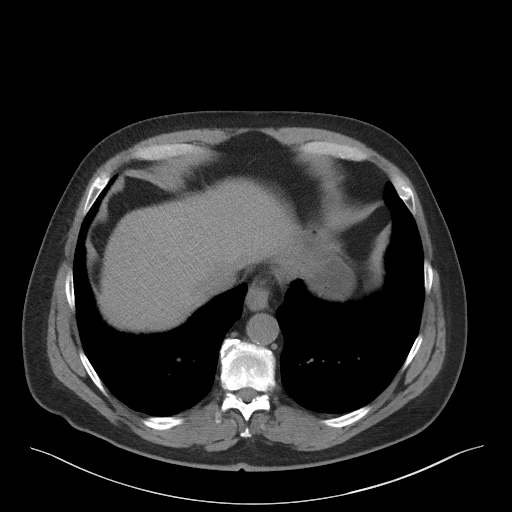
[im 87/99  lung]
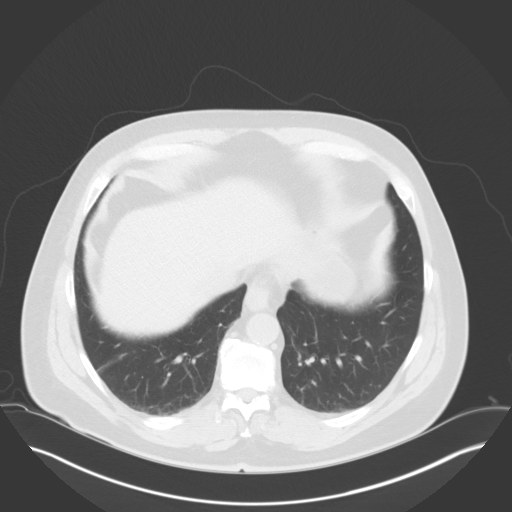
[im 91/99  lung]
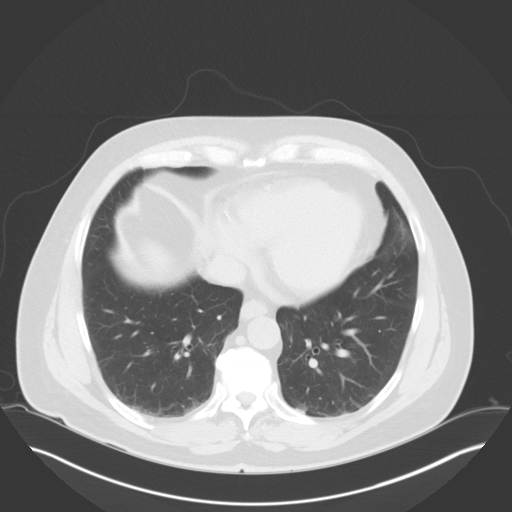
[im 95/99  soft-tissue]
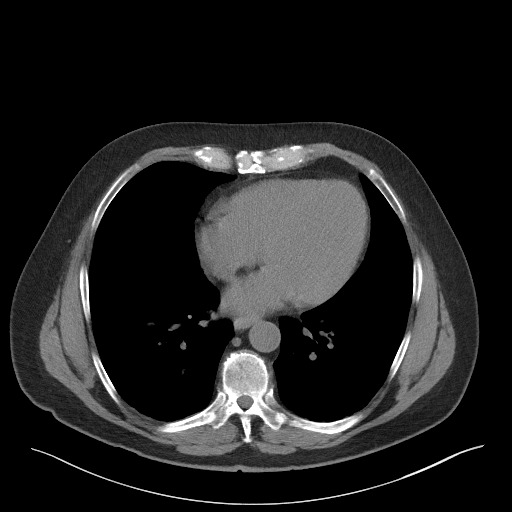
[im 95/99  lung]
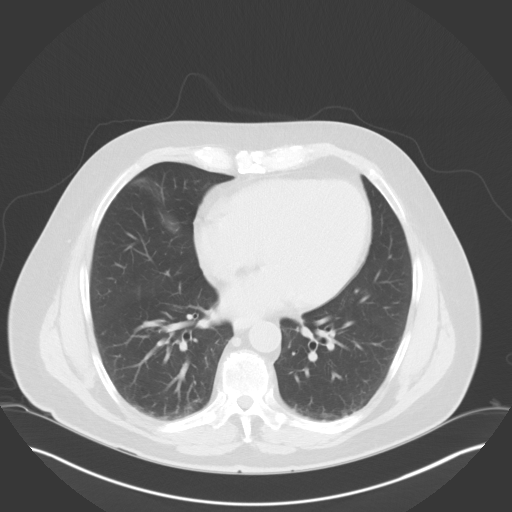

[15 of 32 positions shown; findings below may reference images not displayed]

FINDINGS: Lower chest: No acute abnormality.

Hepatobiliary: No focal liver abnormality is seen. No gallstones,
gallbladder wall thickening, or biliary dilatation.

Pancreas: Unremarkable. No pancreatic ductal dilatation or
surrounding inflammatory changes.

Spleen: Normal in size without focal abnormality.

Adrenals/Urinary Tract: No adrenal masses.

Kidneys are normal in size, orientation and position. Mild right
hydronephrosis with right perinephric stranding. Mild dilation of
the right ureter. 2 mm stone at the right ureterovesicular junction.

No left hydronephrosis. Normal left ureter. Small nonobstructing
stones in the right kidney. No left intrarenal stones. 2 cm lower
pole low-density mass on the right consistent with a cyst stable
from the prior CT.

Bladder is unremarkable.

Stomach/Bowel: Stomach is within normal limits. Appendix appears
normal. No evidence of bowel wall thickening, distention, or
inflammatory changes.

Vascular/Lymphatic: Aortic atherosclerosis. No enlarged abdominal or
pelvic lymph nodes.

Reproductive: Unremarkable.

Other: No abdominal wall hernia or abnormality. No abdominopelvic
ascites.

Musculoskeletal: No fracture or acute finding. No osteoblastic or
osteolytic lesions.
IMPRESSION: 1. 2 mm stone at the right ureterovesicular junction leads to mild
right hydroureteronephrosis and right perinephric stranding.
2. No other acute finding.
3. Small nonobstructing stones within the right kidney.
4. Aortic atherosclerosis.

## 2020-05-03 DIAGNOSIS — J40 Bronchitis, not specified as acute or chronic: Secondary | ICD-10-CM | POA: Diagnosis not present

## 2020-09-18 DIAGNOSIS — E7800 Pure hypercholesterolemia, unspecified: Secondary | ICD-10-CM | POA: Diagnosis not present

## 2020-09-18 DIAGNOSIS — E559 Vitamin D deficiency, unspecified: Secondary | ICD-10-CM | POA: Diagnosis not present

## 2020-09-18 DIAGNOSIS — E119 Type 2 diabetes mellitus without complications: Secondary | ICD-10-CM | POA: Diagnosis not present

## 2020-09-18 DIAGNOSIS — E538 Deficiency of other specified B group vitamins: Secondary | ICD-10-CM | POA: Diagnosis not present

## 2020-09-18 DIAGNOSIS — N1831 Chronic kidney disease, stage 3a: Secondary | ICD-10-CM | POA: Diagnosis not present

## 2020-09-18 DIAGNOSIS — M1A0720 Idiopathic chronic gout, left ankle and foot, without tophus (tophi): Secondary | ICD-10-CM | POA: Diagnosis not present

## 2020-09-19 DIAGNOSIS — E669 Obesity, unspecified: Secondary | ICD-10-CM | POA: Diagnosis not present

## 2020-09-19 DIAGNOSIS — E119 Type 2 diabetes mellitus without complications: Secondary | ICD-10-CM | POA: Diagnosis not present

## 2020-09-19 DIAGNOSIS — M1A0720 Idiopathic chronic gout, left ankle and foot, without tophus (tophi): Secondary | ICD-10-CM | POA: Diagnosis not present

## 2020-09-19 DIAGNOSIS — Z6834 Body mass index (BMI) 34.0-34.9, adult: Secondary | ICD-10-CM | POA: Diagnosis not present

## 2020-09-19 DIAGNOSIS — Z7984 Long term (current) use of oral hypoglycemic drugs: Secondary | ICD-10-CM | POA: Diagnosis not present

## 2020-09-19 DIAGNOSIS — E7800 Pure hypercholesterolemia, unspecified: Secondary | ICD-10-CM | POA: Diagnosis not present

## 2020-09-19 DIAGNOSIS — E559 Vitamin D deficiency, unspecified: Secondary | ICD-10-CM | POA: Diagnosis not present

## 2020-09-19 DIAGNOSIS — E538 Deficiency of other specified B group vitamins: Secondary | ICD-10-CM | POA: Diagnosis not present

## 2020-09-19 DIAGNOSIS — N1831 Chronic kidney disease, stage 3a: Secondary | ICD-10-CM | POA: Diagnosis not present

## 2020-09-19 DIAGNOSIS — I129 Hypertensive chronic kidney disease with stage 1 through stage 4 chronic kidney disease, or unspecified chronic kidney disease: Secondary | ICD-10-CM | POA: Diagnosis not present

## 2020-12-12 ENCOUNTER — Encounter (HOSPITAL_BASED_OUTPATIENT_CLINIC_OR_DEPARTMENT_OTHER): Payer: Self-pay

## 2020-12-12 ENCOUNTER — Emergency Department (HOSPITAL_BASED_OUTPATIENT_CLINIC_OR_DEPARTMENT_OTHER): Payer: PPO

## 2020-12-12 ENCOUNTER — Emergency Department (HOSPITAL_BASED_OUTPATIENT_CLINIC_OR_DEPARTMENT_OTHER)
Admission: EM | Admit: 2020-12-12 | Discharge: 2020-12-12 | Disposition: A | Discharge status: Home / Self Care | Payer: PPO | Attending: Emergency Medicine | Admitting: Emergency Medicine

## 2020-12-12 ENCOUNTER — Other Ambulatory Visit: Payer: Self-pay

## 2020-12-12 DIAGNOSIS — E119 Type 2 diabetes mellitus without complications: Secondary | ICD-10-CM | POA: Insufficient documentation

## 2020-12-12 DIAGNOSIS — Z7984 Long term (current) use of oral hypoglycemic drugs: Secondary | ICD-10-CM | POA: Diagnosis not present

## 2020-12-12 DIAGNOSIS — K5730 Diverticulosis of large intestine without perforation or abscess without bleeding: Secondary | ICD-10-CM | POA: Diagnosis not present

## 2020-12-12 DIAGNOSIS — K828 Other specified diseases of gallbladder: Secondary | ICD-10-CM | POA: Diagnosis not present

## 2020-12-12 DIAGNOSIS — Z79899 Other long term (current) drug therapy: Secondary | ICD-10-CM | POA: Insufficient documentation

## 2020-12-12 DIAGNOSIS — N132 Hydronephrosis with renal and ureteral calculous obstruction: Secondary | ICD-10-CM | POA: Diagnosis not present

## 2020-12-12 DIAGNOSIS — K3189 Other diseases of stomach and duodenum: Secondary | ICD-10-CM | POA: Diagnosis not present

## 2020-12-12 DIAGNOSIS — I10 Essential (primary) hypertension: Secondary | ICD-10-CM | POA: Insufficient documentation

## 2020-12-12 DIAGNOSIS — R1031 Right lower quadrant pain: Secondary | ICD-10-CM | POA: Diagnosis present

## 2020-12-12 DIAGNOSIS — N200 Calculus of kidney: Secondary | ICD-10-CM | POA: Diagnosis not present

## 2020-12-12 DIAGNOSIS — Z7982 Long term (current) use of aspirin: Secondary | ICD-10-CM | POA: Diagnosis not present

## 2020-12-12 LAB — BASIC METABOLIC PANEL
Anion gap: 9 (ref 5–15)
BUN: 20 mg/dL (ref 8–23)
CO2: 23 mmol/L (ref 22–32)
Calcium: 9.6 mg/dL (ref 8.9–10.3)
Chloride: 102 mmol/L (ref 98–111)
Creatinine, Ser: 1.61 mg/dL — ABNORMAL HIGH (ref 0.61–1.24)
GFR, Estimated: 45 mL/min — ABNORMAL LOW (ref >60)
Glucose, Bld: 139 mg/dL — ABNORMAL HIGH (ref 70–99)
Potassium: 4 mmol/L (ref 3.5–5.1)
Sodium: 134 mmol/L — ABNORMAL LOW (ref 135–145)

## 2020-12-12 LAB — CBC WITH DIFFERENTIAL/PLATELET
Abs Immature Granulocytes: 0.04 10*3/uL (ref 0.00–0.07)
Basophils Absolute: 0 10*3/uL (ref 0.0–0.1)
Basophils Relative: 0 %
Eosinophils Absolute: 0 10*3/uL (ref 0.0–0.5)
Eosinophils Relative: 0 %
HCT: 39.9 % (ref 39.0–52.0)
Hemoglobin: 13.8 g/dL (ref 13.0–17.0)
Immature Granulocytes: 0 %
Lymphocytes Relative: 5 %
Lymphs Abs: 0.5 10*3/uL — ABNORMAL LOW (ref 0.7–4.0)
MCH: 32 pg (ref 26.0–34.0)
MCHC: 34.6 g/dL (ref 30.0–36.0)
MCV: 92.6 fL (ref 80.0–100.0)
Monocytes Absolute: 0.3 10*3/uL (ref 0.1–1.0)
Monocytes Relative: 3 %
Neutro Abs: 8.8 10*3/uL — ABNORMAL HIGH (ref 1.7–7.7)
Neutrophils Relative %: 92 %
Platelets: 126 10*3/uL — ABNORMAL LOW (ref 150–400)
RBC: 4.31 MIL/uL (ref 4.22–5.81)
RDW: 12.1 % (ref 11.5–15.5)
WBC: 9.7 10*3/uL (ref 4.0–10.5)
nRBC: 0 % (ref 0.0–0.2)

## 2020-12-12 LAB — URINALYSIS, ROUTINE W REFLEX MICROSCOPIC
Bilirubin Urine: NEGATIVE
Glucose, UA: NEGATIVE mg/dL
Hgb urine dipstick: NEGATIVE
Ketones, ur: NEGATIVE mg/dL
Leukocytes,Ua: NEGATIVE
Nitrite: NEGATIVE
Protein, ur: NEGATIVE mg/dL
Specific Gravity, Urine: >1.03 — ABNORMAL HIGH (ref 1.005–1.030)
pH: 5.5 (ref 5.0–8.0)

## 2020-12-12 MED ORDER — ONDANSETRON HCL 4 MG/2ML IJ SOLN
4.0000 mg | Freq: Once | INTRAMUSCULAR | Status: AC
  Administered 2020-12-12: 19:00:00 4 mg via INTRAVENOUS
  Filled 2020-12-12: qty 2

## 2020-12-12 MED ORDER — OXYCODONE-ACETAMINOPHEN 5-325 MG PO TABS: 1.0000 | ORAL_TABLET | Freq: Four times a day (QID) | ORAL | 0 refills | Status: AC | PRN

## 2020-12-12 MED ORDER — MORPHINE SULFATE (PF) 4 MG/ML IV SOLN
4.0000 mg | Freq: Once | INTRAVENOUS | Status: AC
  Administered 2020-12-12: 19:00:00 4 mg via INTRAVENOUS
  Filled 2020-12-12 (×2): qty 1

## 2020-12-12 NOTE — ED Notes (Signed)
Patient transported to CT 

## 2020-12-12 NOTE — ED Triage Notes (Signed)
Pt presents with right sided abdominal pain and flank pain. Hx of kidney stones, feels similar. C/o nausea.

## 2020-12-12 NOTE — ED Provider Notes (Signed)
[facility] EMERGENCY DEPARTMENT Provider Note   CSN: [id number] Arrival date & time: 12/12/20  1623     History Chief Complaint  Patient presents with   Flank Pain    Daniel Ingram is a 75 y.o. male.  Patient is a 75 year old male with a history of a left bundle branch block, diabetes, hypertension and kidney stones.  He is presenting today with complaint of suprapubic and right flank pain.  He was in his normal state of health today until 2:00 when he suddenly started having suprapubic pain.  This continued for a while and then started radiating into his right flank.  The pain got as severe as a 9 out of 10 and he was unable to sit still but he did take some over-the-counter pain medicine which has improved the pain to a 6 out of 10.  He has not had any difficulty urinating denies any vomiting but had a little bit of nausea today.  He tried to have a bowel movement thinking that would help with the pain but he was only able to get a little bit out and the pain did not improve.  He has a prior history of kidney stones last one was over 2 years ago but reports this feels similar.  The history is provided by the patient.  Flank Pain This is a recurrent problem. The current episode started 3 to 5 hours ago. The problem occurs constantly. The problem has not changed since onset.Associated symptoms include abdominal pain. Associated symptoms comments: Right flank pain. Nothing aggravates the symptoms. Nothing relieves the symptoms. Treatments tried: otc pain meds and now pain down to 6/10. The treatment provided mild relief.      Past Medical History:  Diagnosis Date   BBB (bundle branch block)    Left   BPH (benign prostatic hyperplasia)    Diabetes mellitus without complication ([place])    Gout    Hypertension    Kidney stones    Renal disorder     There are no problems to display for this patient.   Past Surgical History:  Procedure Laterality Date   FOOT SURGERY          No family history on file.  Social History   Tobacco Use   Smoking status: Never   Smokeless tobacco: Never  Substance Use Topics   Alcohol use: Not Currently    Comment: one beer a day   Drug use: No    Home Medications Prior to Admission medications   Medication Sig Start Date End Date Taking? Authorizing Provider  allopurinol (ZYLOPRIM) 300 MG tablet Take 300 mg by mouth daily.    [provider]  aspirin 81 MG tablet Take 81 mg by mouth daily.    [provider]  atorvastatin (LIPITOR) 40 MG tablet Take 40 mg by mouth daily.    [provider]  benazepril (LOTENSIN) 20 MG tablet Take 20 mg by mouth daily.    [provider]  metFORMIN (GLUCOPHAGE) 500 MG tablet Take 2,000 mg by mouth 2 (two) times daily with a meal.    [provider]  ondansetron (ZOFRAN ODT) 4 MG disintegrating tablet Take 1 tablet (4 mg total) by mouth every 8 (eight) hours as needed for nausea or vomiting. 03/05/16   Fredia Sorrow, MD  oxyCODONE-acetaminophen (PERCOCET/ROXICET) 5-325 MG tablet Take 1-2 tablets by mouth every 6 (six) hours as needed for severe pain. 02/11/18   Hayden Rasmussen, MD  polyethylene glycol (  MIRALAX) packet Take 17 g by mouth daily. 03/05/16   Fredia Sorrow, MD  tamsulosin (FLOMAX) 0.4 MG CAPS capsule Take 1 capsule (0.4 mg total) by mouth daily. 02/11/18   Hayden Rasmussen, MD    Allergies    Patient has no known allergies.  Review of Systems   Review of Systems  Gastrointestinal:  Positive for abdominal pain.  Genitourinary:  Positive for flank pain.  All other systems reviewed and are negative.  Physical Exam Updated Vital Signs BP (!) 150/87 (BP Location: Left Arm)   Pulse 83   Temp 98.1 F (36.7 C) (Oral)   Resp 18   Ht 5\' 7"  (1.702 m)   Wt 84.8 kg   SpO2 100%   BMI 29.29 kg/m   Physical Exam Vitals and nursing note reviewed.  Constitutional:      General: He is not in acute distress.    Appearance:  He is well-developed.  HENT:     Head: Normocephalic and atraumatic.  Eyes:     Conjunctiva/sclera: Conjunctivae normal.     Pupils: Pupils are equal, round, and reactive to light.  Cardiovascular:     Rate and Rhythm: Normal rate and regular rhythm.     Heart sounds: No murmur heard. Pulmonary:     Effort: Pulmonary effort is normal. No respiratory distress.     Breath sounds: Normal breath sounds. No wheezing or rales.  Abdominal:     General: There is no distension.     Palpations: Abdomen is soft.     Tenderness: There is no abdominal tenderness. There is no right CVA tenderness, left CVA tenderness, guarding or rebound.  Musculoskeletal:        General: No tenderness. Normal range of motion.     Cervical back: Normal range of motion and neck supple.  Skin:    General: Skin is warm and dry.     Findings: No erythema or rash.  Neurological:     Mental Status: He is alert and oriented to person, place, and time.  Psychiatric:        Behavior: Behavior normal.    ED Results / Procedures / Treatments   Labs (all labs ordered are listed, but only abnormal results are displayed) Labs Reviewed  URINALYSIS, ROUTINE W REFLEX MICROSCOPIC - Abnormal; Notable for the following components:      Result Value   Specific Gravity, Urine >1.030 (*)    All other components within normal limits  CBC WITH DIFFERENTIAL/PLATELET - Abnormal; Notable for the following components:   Platelets 126 (*)    Neutro Abs 8.8 (*)    Lymphs Abs 0.5 (*)    All other components within normal limits  BASIC METABOLIC PANEL - Abnormal; Notable for the following components:   Sodium 134 (*)    Glucose, Bld 139 (*)    Creatinine, Ser 1.61 (*)    GFR, Estimated 45 (*)    All other components within normal limits    EKG None  Radiology CT Renal Stone Study  Result Date: 12/12/2020 CLINICAL DATA:  Flank pain, kidney stone suspected EXAM: CT ABDOMEN AND PELVIS WITHOUT CONTRAST TECHNIQUE: Multidetector  CT imaging of the abdomen and pelvis was performed following the standard protocol without IV contrast. COMPARISON:  02/11/2018 FINDINGS: Lower chest: Minor subsegmental atelectasis in the right lower lobe. Lung bases are otherwise clear. The heart is normal in size. Hepatobiliary: No focal liver abnormality is seen on this unenhanced exam. Suggestion of mild hepatic steatosis. Gallbladder physiologically  distended, no calcified stone. No biliary dilatation. Pancreas: No ductal dilatation or inflammation. Spleen: Normal in size without focal abnormality. Adrenals/Urinary Tract: Normal adrenal glands. Punctate 2 mm stone at the right ureterovesicular junction with mild proximal hydroureteronephrosis. There is mild perinephric edema. Two additional punctate nonobstructing intrarenal calculi on the right. No left renal ureteral stones. No left hydronephrosis. There is mild left perinephric edema. Urinary bladder is near completely empty. Stomach/Bowel: Mild sigmoid colonic diverticulosis. No diverticulitis. Normal appendix visualized. Ingested material distends the stomach. No small bowel obstruction or inflammation. Vascular/Lymphatic: Moderate aortic atherosclerosis. No aortic aneurysm. No portal venous or mesenteric gas. No enlarged lymph nodes in the abdomen or pelvis. Reproductive: Prostate is unremarkable. Other: No free air, free fluid, or intra-abdominal fluid collection. Minimal fat within both inguinal canals. Musculoskeletal: There are no acute or suspicious osseous abnormalities. 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 IMPRESSION: 1. Punctate 2 mm stone at the right ureterovesicular junction with mild proximal hydroureteronephrosis. 2. Additional punctate nonobstructing calculi on the right. 3. Mild sigmoid colonic diverticulosis without diverticulitis. Aortic Atherosclerosis (ICD10-I70.0). Electronically Signed   By: Keith Rake M.D.   On: 12/12/2020 18:44    Procedures Procedures    Medications Ordered in ED Medications  morphine 4 MG/ML injection 4 mg (has no administration in time range)  ondansetron (ZOFRAN) injection 4 mg (has no administration in time range)    ED Course  I have reviewed the triage vital signs and the nursing notes.  Pertinent labs & imaging results that were available during my care of the patient were reviewed by me and considered in my medical decision making (see chart for details).    MDM Rules/Calculators/A&P                           Patient presenting today with sudden onset of suprapubic pain that then started radiating into his right flank.  He reports the pain is currently a 6 out of 10.  Prior history of kidney stones and this could be a renal stone however there is no blood in his urine.  There is no evidence of infection.  Also concern for possible diverticulitis or bladder stone.  Labs and imaging are pending.  Patient given pain control.  Low suspicion for dissection, AAA.  7:34 PM CBC within normal limits, BMP with creatinine 1.6 which is not different from baseline.  CT with a 2 mm stone.  Patient is pain controlled and will discharge home with pain control.  MDM   Amount and/or Complexity of Data Reviewed Clinical lab tests: ordered and reviewed Tests in the radiology section of CPT: ordered and reviewed Independent visualization of images, tracings, or specimens: yes    Final Clinical Impression(s) / ED Diagnoses Final diagnoses:  Kidney stone    Rx / DC Orders ED Discharge Orders          Ordered    oxyCODONE-acetaminophen (PERCOCET/ROXICET) 5-325 MG tablet  Every 6 hours PRN        12/12/20 1932             Blanchie Dessert, MD 12/12/20 1935

## 2020-12-12 NOTE — ED Notes (Signed)
Patient discharged to home.  All discharge instructions reviewed.  Patient verbalized understanding via teachback method.  VS WDL.  Respirations even and unlabored.  Ambulatory out of ED.   °

## 2020-12-17 DIAGNOSIS — E119 Type 2 diabetes mellitus without complications: Secondary | ICD-10-CM | POA: Diagnosis not present

## 2020-12-23 DIAGNOSIS — E119 Type 2 diabetes mellitus without complications: Secondary | ICD-10-CM | POA: Diagnosis not present

## 2020-12-23 DIAGNOSIS — E663 Overweight: Secondary | ICD-10-CM | POA: Diagnosis not present

## 2021-01-06 DIAGNOSIS — H5213 Myopia, bilateral: Secondary | ICD-10-CM | POA: Diagnosis not present

## 2021-01-06 DIAGNOSIS — H25013 Cortical age-related cataract, bilateral: Secondary | ICD-10-CM | POA: Diagnosis not present

## 2021-01-06 DIAGNOSIS — H5111 Convergence insufficiency: Secondary | ICD-10-CM | POA: Diagnosis not present

## 2021-01-06 DIAGNOSIS — H43813 Vitreous degeneration, bilateral: Secondary | ICD-10-CM | POA: Diagnosis not present

## 2021-01-06 DIAGNOSIS — E119 Type 2 diabetes mellitus without complications: Secondary | ICD-10-CM | POA: Diagnosis not present

## 2021-01-06 DIAGNOSIS — H40003 Preglaucoma, unspecified, bilateral: Secondary | ICD-10-CM | POA: Diagnosis not present

## 2021-01-06 DIAGNOSIS — H52203 Unspecified astigmatism, bilateral: Secondary | ICD-10-CM | POA: Diagnosis not present

## 2021-01-06 DIAGNOSIS — Z7984 Long term (current) use of oral hypoglycemic drugs: Secondary | ICD-10-CM | POA: Diagnosis not present

## 2021-01-06 DIAGNOSIS — H2513 Age-related nuclear cataract, bilateral: Secondary | ICD-10-CM | POA: Diagnosis not present

## 2021-01-06 DIAGNOSIS — H524 Presbyopia: Secondary | ICD-10-CM | POA: Diagnosis not present

## 2023-02-09 IMAGING — CT CT RENAL STONE PROTOCOL
2 of 4 series · 16 of 46 positions shown, 18 images · non-contrast
Comparison: 02/11/2018

CLINICAL DATA: Flank pain, kidney stone suspected

EXAM:
CT ABDOMEN AND PELVIS WITHOUT CONTRAST
TECHNIQUE: Multidetector CT imaging of the abdomen and pelvis was performed
following the standard protocol without IV contrast.

[Series 2: axial st · axial · 0.91mm/px · z∈[-714,-294]mm · 13 of 94 slices shown, 15 images]
[im 5/94  soft-tissue]
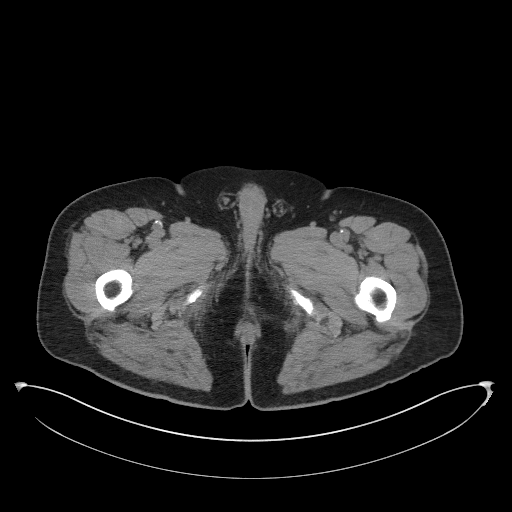
[im 5/94  bone]
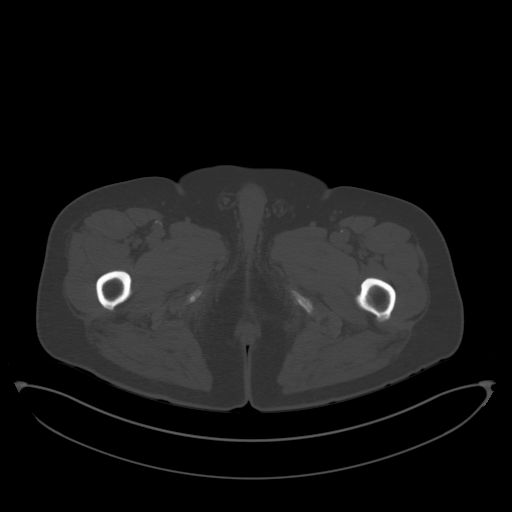
[im 13/94  soft-tissue]
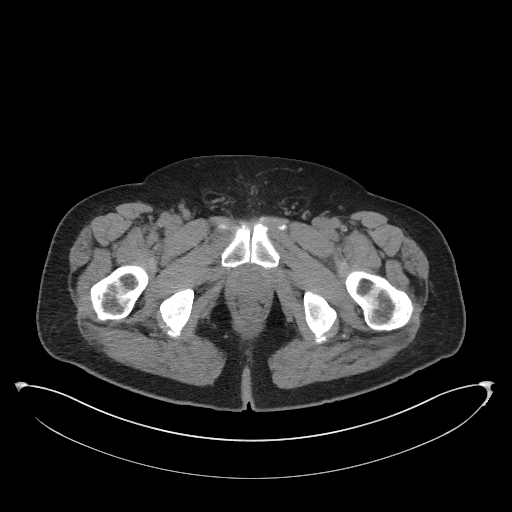
[im 21/94  soft-tissue]
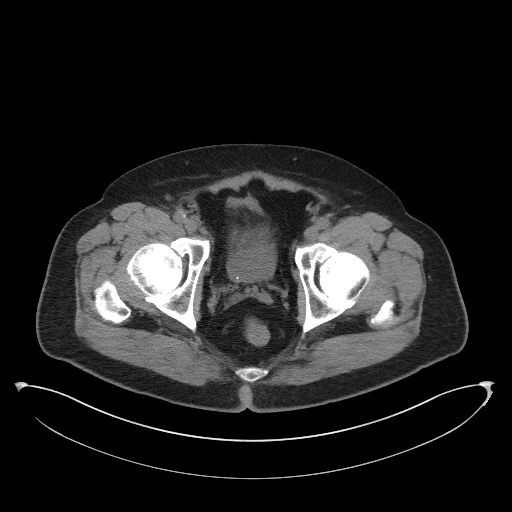
[im 25/94  soft-tissue]
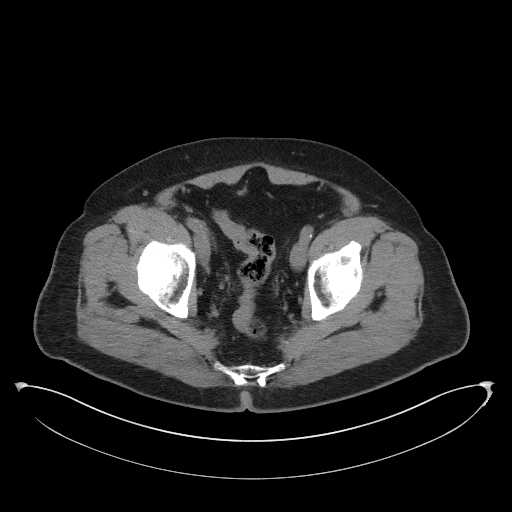
[im 33/94  soft-tissue]
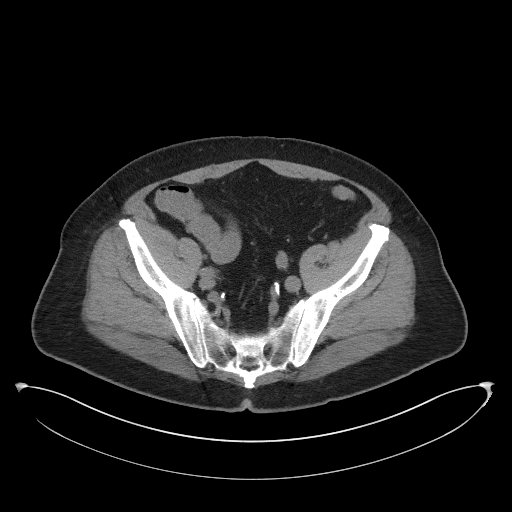
[im 41/94  soft-tissue]
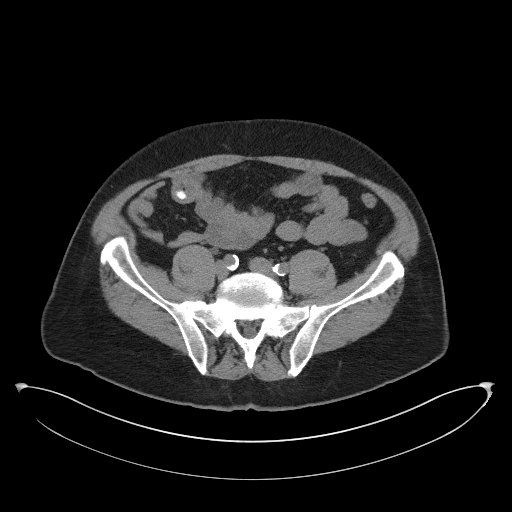
[im 49/94  soft-tissue]
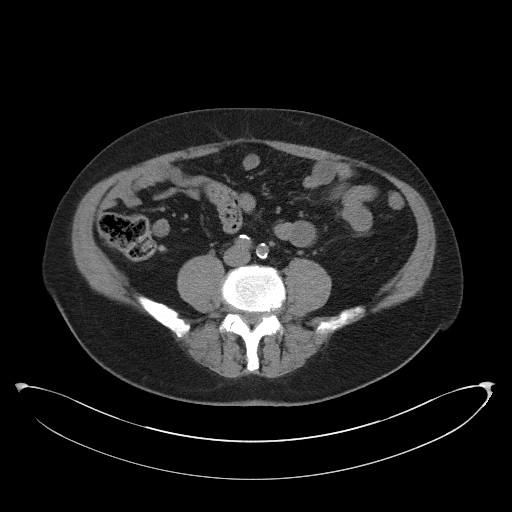
[im 53/94  soft-tissue]
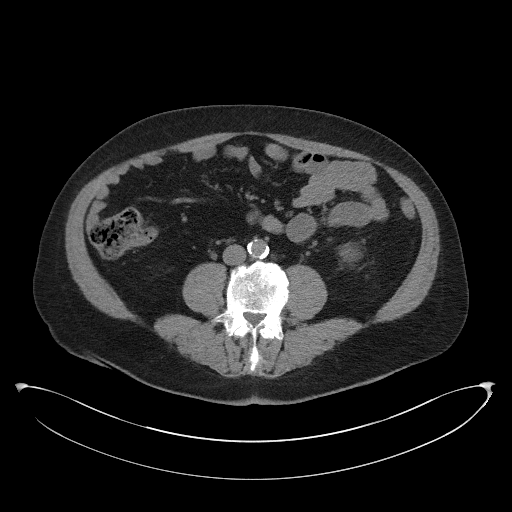
[im 61/94  soft-tissue]
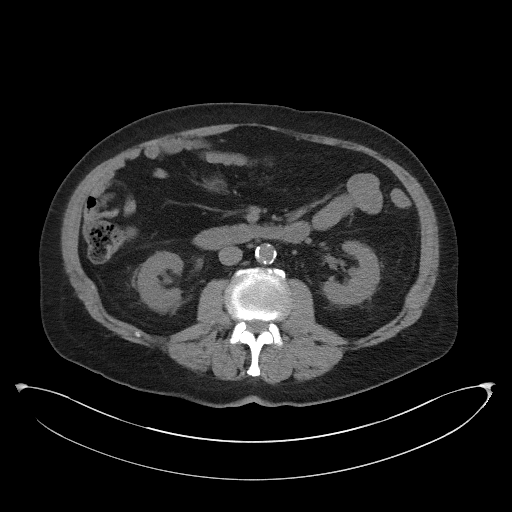
[im 61/94  bone]
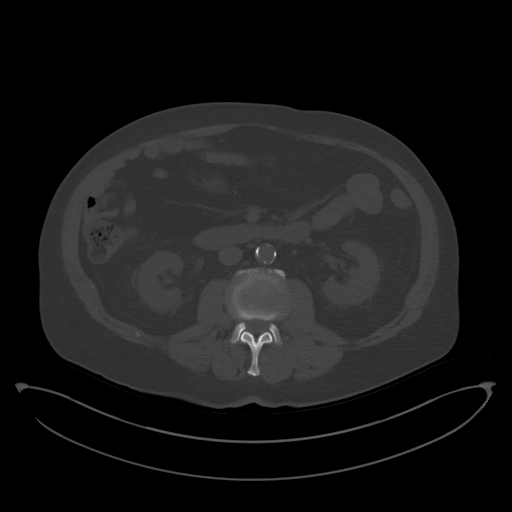
[im 69/94  soft-tissue]
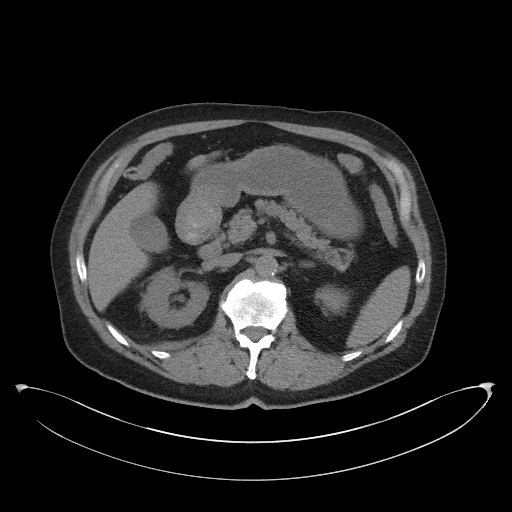
[im 73/94  soft-tissue]
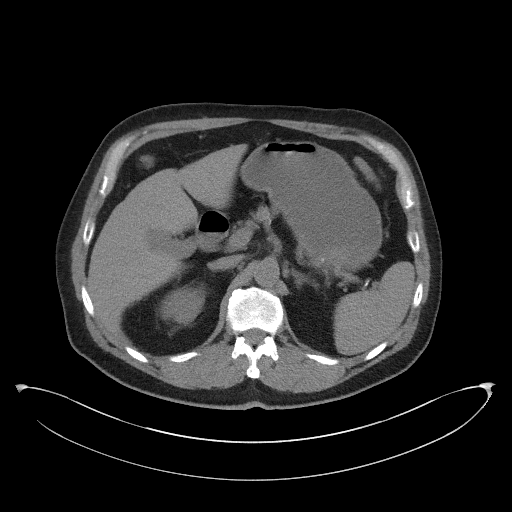
[im 81/94  soft-tissue]
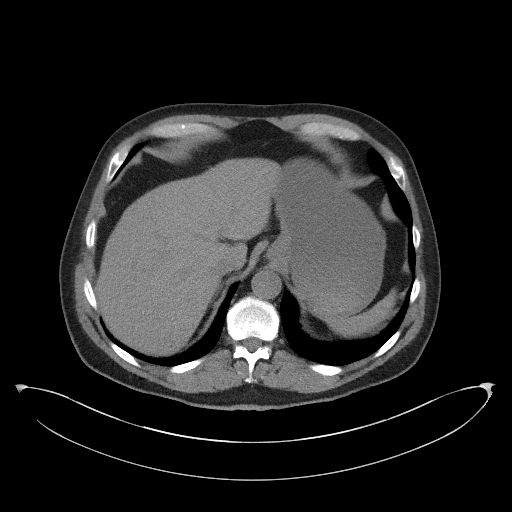
[im 89/94  soft-tissue]
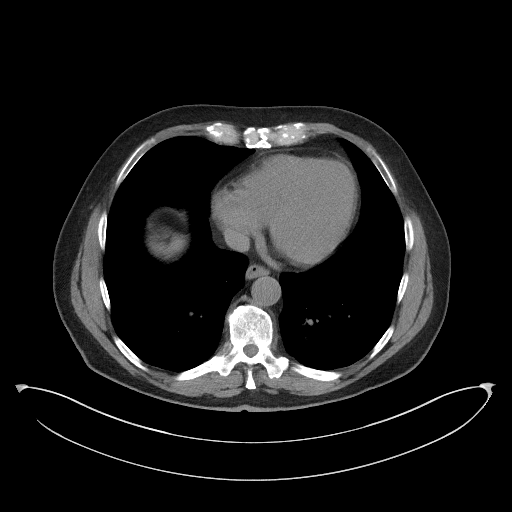

[Series 5: coronal st · coronal · 0.91mm/px · 3 of 109 slices shown]
[im 37/109  soft-tissue]
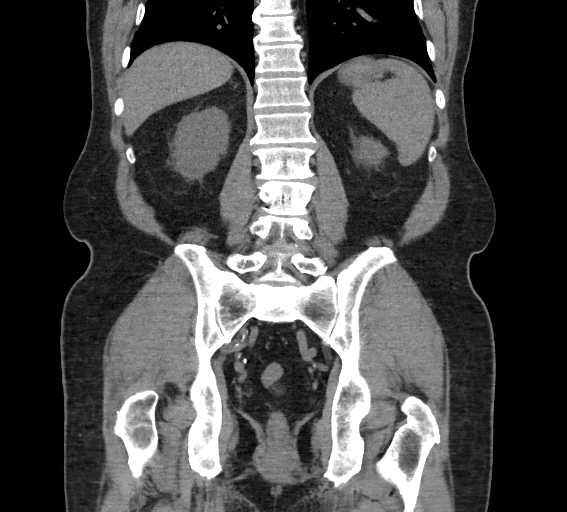
[im 49/109  soft-tissue]
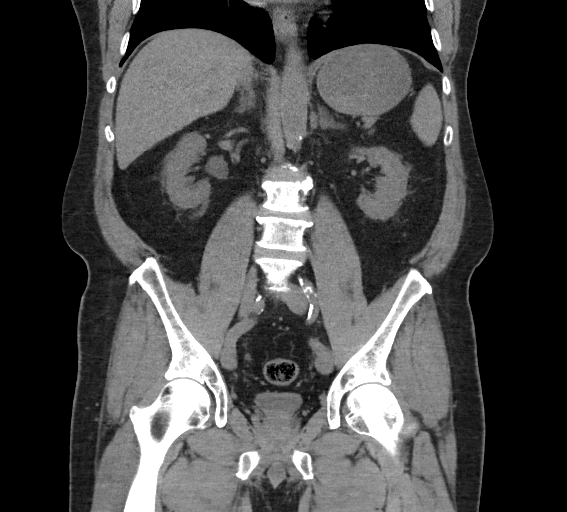
[im 61/109  soft-tissue]
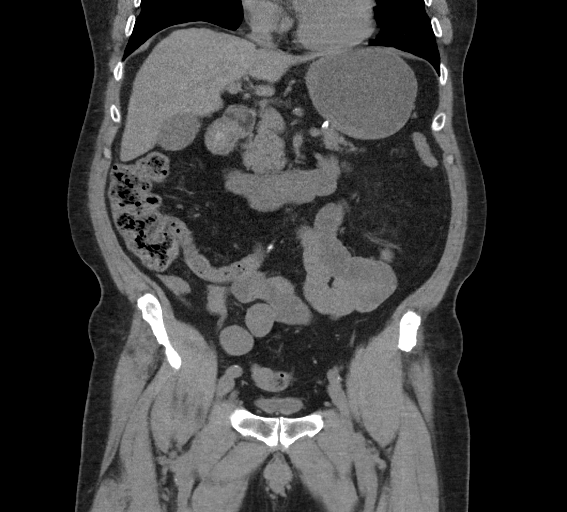

[16 of 46 positions shown; findings below may reference images not displayed]

FINDINGS: Lower chest: Minor subsegmental atelectasis in the right lower lobe.
Lung bases are otherwise clear. The heart is normal in size.

Hepatobiliary: No focal liver abnormality is seen on this unenhanced
exam. Suggestion of mild hepatic steatosis. Gallbladder
physiologically distended, no calcified stone. No biliary
dilatation.

Pancreas: No ductal dilatation or inflammation.

Spleen: Normal in size without focal abnormality.

Adrenals/Urinary Tract: Normal adrenal glands. Punctate 2 mm stone
at the right ureterovesicular junction with mild proximal
hydroureteronephrosis. There is mild perinephric edema. Two
additional punctate nonobstructing intrarenal calculi on the right.
No left renal ureteral stones. No left hydronephrosis. There is mild
left perinephric edema. Urinary bladder is near completely empty.

Stomach/Bowel: Mild sigmoid colonic diverticulosis. No
diverticulitis. Normal appendix visualized. Ingested material
distends the stomach. No small bowel obstruction or inflammation.

Vascular/Lymphatic: Moderate aortic atherosclerosis. No aortic
aneurysm. No portal venous or mesenteric gas. No enlarged lymph
nodes in the abdomen or pelvis.

Reproductive: Prostate is unremarkable.

Other: No free air, free fluid, or intra-abdominal fluid collection.
Minimal fat within both inguinal canals.

Musculoskeletal: There are no acute or suspicious osseous
abnormalities. Multilevel degenerative disc disease in the spine.
IMPRESSION: 1. Punctate 2 mm stone at the right ureterovesicular junction with
mild proximal hydroureteronephrosis.
2. Additional punctate nonobstructing calculi on the right.
3. Mild sigmoid colonic diverticulosis without diverticulitis.

Aortic Atherosclerosis ([id number]).
